# Patient Record
Sex: Male | Born: 1970 | Race: Black or African American | Hispanic: No | Marital: Married | Smoking: Current every day smoker
Health system: Southern US, Community
[De-identification: ages and names within clinical notes are randomized; demographics above are authoritative.]

## PROBLEM LIST (undated history)

## (undated) DIAGNOSIS — D649 Anemia, unspecified: Secondary | ICD-10-CM

## (undated) DIAGNOSIS — I10 Essential (primary) hypertension: Secondary | ICD-10-CM

## (undated) DIAGNOSIS — J45909 Unspecified asthma, uncomplicated: Secondary | ICD-10-CM

## (undated) HISTORY — PX: NO PAST SURGERIES: SHX2092

## (undated) HISTORY — DX: Anemia, unspecified: D64.9

## (undated) HISTORY — DX: Essential (primary) hypertension: I10

---

## 1998-09-28 ENCOUNTER — Emergency Department (HOSPITAL_COMMUNITY): Admission: EM | Admit: 1998-09-28 | Discharge: 1998-09-28 | Payer: Self-pay | Admitting: Emergency Medicine

## 2003-07-07 ENCOUNTER — Emergency Department (HOSPITAL_COMMUNITY): Admission: EM | Admit: 2003-07-07 | Discharge: 2003-07-07 | Payer: Self-pay | Admitting: Emergency Medicine

## 2010-10-13 ENCOUNTER — Emergency Department (HOSPITAL_COMMUNITY)
Admission: EM | Admit: 2010-10-13 | Discharge: 2010-10-13 | Discharge status: Against Medical Advice | Payer: Self-pay | Attending: Emergency Medicine | Admitting: Emergency Medicine

## 2010-10-13 DIAGNOSIS — R030 Elevated blood-pressure reading, without diagnosis of hypertension: Secondary | ICD-10-CM | POA: Insufficient documentation

## 2011-09-24 ENCOUNTER — Emergency Department (HOSPITAL_COMMUNITY)
Admission: EM | Admit: 2011-09-24 | Discharge: 2011-09-25 | Disposition: A | Discharge status: Home / Self Care | Payer: Self-pay | Attending: Emergency Medicine | Admitting: Emergency Medicine

## 2011-09-24 ENCOUNTER — Encounter (HOSPITAL_COMMUNITY): Payer: Self-pay | Admitting: Emergency Medicine

## 2011-09-24 DIAGNOSIS — R000 Tachycardia, unspecified: Secondary | ICD-10-CM | POA: Insufficient documentation

## 2011-09-24 DIAGNOSIS — I160 Hypertensive urgency: Secondary | ICD-10-CM

## 2011-09-24 DIAGNOSIS — R7309 Other abnormal glucose: Secondary | ICD-10-CM | POA: Insufficient documentation

## 2011-09-24 DIAGNOSIS — J45909 Unspecified asthma, uncomplicated: Secondary | ICD-10-CM | POA: Insufficient documentation

## 2011-09-24 DIAGNOSIS — R030 Elevated blood-pressure reading, without diagnosis of hypertension: Secondary | ICD-10-CM | POA: Insufficient documentation

## 2011-09-24 DIAGNOSIS — F17200 Nicotine dependence, unspecified, uncomplicated: Secondary | ICD-10-CM | POA: Insufficient documentation

## 2011-09-24 DIAGNOSIS — R739 Hyperglycemia, unspecified: Secondary | ICD-10-CM

## 2011-09-24 LAB — CBC
MCH: 29.2 pg (ref 26.0–34.0)
MCV: 82.8 fL (ref 78.0–100.0)
Platelets: 181 10*3/uL (ref 150–400)
RDW: 13.3 % (ref 11.5–15.5)
WBC: 7.6 10*3/uL (ref 4.0–10.5)

## 2011-09-24 LAB — DIFFERENTIAL
Basophils Absolute: 0.1 10*3/uL (ref 0.0–0.1)
Basophils Relative: 1 % (ref 0–1)
Eosinophils Absolute: 0.4 10*3/uL (ref 0.0–0.7)
Eosinophils Relative: 5 % (ref 0–5)
Lymphocytes Relative: 51 % — ABNORMAL HIGH (ref 12–46)

## 2011-09-24 LAB — BASIC METABOLIC PANEL
CO2: 24 mEq/L (ref 19–32)
Calcium: 8.9 mg/dL (ref 8.4–10.5)
Creatinine, Ser: 1.2 mg/dL (ref 0.50–1.35)

## 2011-09-24 NOTE — ED Notes (Addendum)
Blood pressure at home was 264/163 right hand, baseline BP 160/90. Not taking any blood pressure. frequent HA and vision problem to left eye

## 2011-09-24 NOTE — ED Notes (Signed)
PT. REPORTED ELEVATED BLOOD PRESSURE THIS EVENING AT HOME = 200/160 , STATES FEELS TIRED AND DIZZY.

## 2011-09-25 NOTE — ED Provider Notes (Signed)
History     CSN: [id number]  Arrival date & time 09/24/11  2257   First MD Initiated Contact with Patient 09/24/11 2332      Chief Complaint  Patient presents with  . Hypertension    (Consider location/radiation/quality/duration/timing/severity/associated sxs/prior treatment) HPI The patient presents with concerns of lightheadedness, hypertension, fatigue.  He notes that over the course the day he has felt fatigued, with mild intermittent lightheadedness.  As these symptoms persisted he checked his blood pressure, and found his readings were elevated.  He notes the result was 200/160.  He presents here for evaluation of these elevated results.  He notes that since the time of taking it reading until my evaluation his spontaneously become better.  He notes that he is without any complaints on my evaluation. History the past week his intermittent complaints have been occurring with seemingly spontaneous onset and offset.  He does note that he has been using his albuterol inhaler with greater frequency over this time period. The patient denies a history of hypertension, states that asthma is his only medical problem. History reviewed. No pertinent past medical history.  History reviewed. No pertinent past surgical history.  No family history on file.  History  Substance Use Topics  . Smoking status: Current Everyday Smoker  . Smokeless tobacco: Not on file  . Alcohol Use: Yes      Review of Systems  Constitutional:       Per HPI, otherwise negative  HENT:       Per HPI, otherwise negative  Eyes: Negative.   Respiratory:       Per HPI, otherwise negative  Cardiovascular:       Per HPI, otherwise negative  Gastrointestinal: Negative for vomiting.  Genitourinary: Negative.   Musculoskeletal:       Per HPI, otherwise negative  Skin: Negative.   Neurological: Negative for syncope.    Allergies  Review of patient's allergies indicates no known allergies.  Home  Medications   Current Outpatient Rx  Name Route Sig Dispense Refill  . ALBUTEROL SULFATE HFA 108 (90 BASE) MCG/ACT IN AERS Inhalation Inhale 2 puffs into the lungs every 6 (six) hours as needed. For shortness of breath/asthma symptoms    . DIPHENHYDRAMINE HCL 25 MG PO CAPS Oral Take 25 mg by mouth daily as needed. For allergies    . THERA M PLUS PO TABS Oral Take 1 tablet by mouth daily.      BP 144/95  Pulse 87  Temp 97.9 F (36.6 C) (Oral)  Resp 19  SpO2 95%  Physical Exam  Nursing note and vitals reviewed. Constitutional: He is oriented to person, place, and time. He appears well-developed. No distress.  HENT:  Head: Normocephalic and atraumatic.  Eyes: Conjunctivae and EOM are normal.  Cardiovascular: Normal rate and regular rhythm.   Pulmonary/Chest: Effort normal. No stridor. No respiratory distress.  Abdominal: He exhibits no distension.  Musculoskeletal: He exhibits no edema.  Neurological: He is alert and oriented to person, place, and time. He displays no atrophy and no tremor. No cranial nerve deficit or sensory deficit. He exhibits normal muscle tone. He displays no seizure activity. Coordination normal.  Skin: Skin is warm and dry.  Psychiatric: He has a normal mood and affect.    ED Course  Procedures (including critical care time)  Labs Reviewed  BASIC METABOLIC PANEL - Abnormal; Notable for the following:    Glucose, Bld 166 (*)     GFR calc non Af Amer 75 (*)  GFR calc Af Amer 87 (*)     All other components within normal limits  DIFFERENTIAL - Abnormal; Notable for the following:    Neutrophils Relative 37 (*)     Lymphocytes Relative 51 (*)     All other components within normal limits  CBC   No results found.   No diagnosis found.   Cardiac 80 sinus rhythm normal Pulse oximetry 96% room air normal    Date: 09/25/2011 #1  Rate: 75  Rhythm: normal sinus rhythm  QRS Axis: normal  Intervals: normal  ST/T Wave abnormalities: early  repolarization  Conduction Disutrbances:none  Narrative Interpretation:   Old EKG Reviewed: none available BORDERLINE (LVH, repol)   Date: 09/25/2011 #2 - Patient noted chest fluttering sensation  Rate: 66  Rhythm: normal sinus rhythm and sinus arrhythmia  QRS Axis: normal  Intervals: normal  ST/T Wave abnormalities: early repolarization  Conduction Disutrbances:none  Narrative Interpretation:   Old EKG Reviewed: changes noted Borderline ECG   MDM  This 41 year old male presents with concerns of hypertension, intermittent lightheadedness.  Notably, throughout the patient's ED stay his vital signs were essentially unremarkable with one mildly elevated blood pressure.  The patient was mildly tachycardic, and his evaluation demonstrated hyperglycemia, without evidence of endorgan effects of sustained hypertension.  The patient's recollection of innumerable episodes of chest fluttering reoccurred while he was in the emergency department, and a repeat ECG didn't demonstrated sinus arrhythmia.  The remainder of the patient's evaluation was not remarkable for suggestion of ongoing cardiac ischemia, as he denied any significant chest pain, stated that his fluttering sensation was similar to that he is felt on in numerable prior episodes.  I discussed the need for ongoing primary care management of the patient's blood pressure, and glucose, at length with the patient and his wife.  He was discharged in stable condition.       Gerhard Munch, MD 09/25/11 [phone]

## 2011-09-25 NOTE — ED Notes (Signed)
Lengthening of t-p wave increased.  Pt states heart feels "different", but denies pain.  EKG shot to give to Dr Jeraldine Loots.

## 2011-09-25 NOTE — ED Notes (Signed)
Pt sats went down to 77. Went into the room and told pt to take some deep breaths and sats went up to 95. When asked if he had anxiety he said "the beeping on the machine was making him nervous"

## 2011-09-25 NOTE — Discharge Instructions (Signed)
It is very important that you follow up with your physician, be telephoned to arrange the next available appointment to continue evaluation of your blood pressure and your chest fluttering sensation.  Notably, tight evaluation also demonstrated that your sugar was elevated, and this should be discussed as well.  If you develop any new, or concerning changes in the interim a, please make sure to return to emergency department immediately for additional evaluation.

## 2012-09-02 ENCOUNTER — Emergency Department (HOSPITAL_COMMUNITY)
Admission: EM | Admit: 2012-09-02 | Discharge: 2012-09-02 | Disposition: A | Discharge status: Home / Self Care | Payer: BC Managed Care – PPO | Attending: Emergency Medicine | Admitting: Emergency Medicine

## 2012-09-02 ENCOUNTER — Encounter (HOSPITAL_COMMUNITY): Payer: Self-pay | Admitting: Emergency Medicine

## 2012-09-02 DIAGNOSIS — K089 Disorder of teeth and supporting structures, unspecified: Secondary | ICD-10-CM | POA: Insufficient documentation

## 2012-09-02 DIAGNOSIS — K0889 Other specified disorders of teeth and supporting structures: Secondary | ICD-10-CM

## 2012-09-02 DIAGNOSIS — J45901 Unspecified asthma with (acute) exacerbation: Secondary | ICD-10-CM | POA: Insufficient documentation

## 2012-09-02 DIAGNOSIS — Z87891 Personal history of nicotine dependence: Secondary | ICD-10-CM | POA: Insufficient documentation

## 2012-09-02 DIAGNOSIS — Z79899 Other long term (current) drug therapy: Secondary | ICD-10-CM | POA: Insufficient documentation

## 2012-09-02 HISTORY — DX: Unspecified asthma, uncomplicated: J45.909

## 2012-09-02 MED ORDER — NAPROXEN 500 MG PO TABS: 500.0000 mg | ORAL_TABLET | Freq: Two times a day (BID) | ORAL | Status: DC

## 2012-09-02 MED ORDER — AMOXICILLIN 500 MG PO CAPS: 500.0000 mg | ORAL_CAPSULE | Freq: Three times a day (TID) | ORAL | Status: DC

## 2012-09-02 MED ORDER — HYDROCODONE-ACETAMINOPHEN 5-325 MG PO TABS
1.0000 | ORAL_TABLET | Freq: Once | ORAL | Status: AC
  Administered 2012-09-02: 1 via ORAL
  Filled 2012-09-02: qty 1

## 2012-09-02 MED ORDER — HYDROCODONE-ACETAMINOPHEN 5-325 MG PO TABS: 1.0000 | ORAL_TABLET | Freq: Four times a day (QID) | ORAL | Status: DC | PRN

## 2012-09-02 NOTE — ED Notes (Signed)
Pt c/o R lower wisdom tooth pain x 2 days.

## 2012-09-02 NOTE — ED Provider Notes (Signed)
History    This chart was scribed for non-physician practitioner, Lottie Mussel, PA-C, working with Geoffery Lyons, MD by Melba Coon, ED Scribe. This patient was seen in room TR06C/TR06C and the patient's care was started at 6:41PM.   CSN: [id number]  Arrival date & time 09/02/12  1714   First MD Initiated Contact with Patient 09/02/12 1804      Chief Complaint  Patient presents with  . Dental Pain    (Consider location/radiation/quality/duration/timing/severity/associated sxs/prior treatment) The history is provided by the patient. No language interpreter was used.   HPI Comments: Casey [name] is a 42 y.o. male who presents to the Emergency Department complaining of constant, moderate to severe right lower wisdom tooth pain with an onset 2 days ago that has been getting progressively worse. The pain has been severe enough to awake him from sleep. He reports chills at home and here at the ED but temp here at the ED was 98.4. He reports that moving his jaw aggravates the pain.  He reports that he has been recommended for wisdom tooth removal in the past about a year ago. No known allergies. No other pertinent medical symptoms. No facial swelling, no swelling under the tongue.   Past Medical History  Diagnosis Date  . Asthma     History reviewed. No pertinent past surgical history.  No family history on file.  History  Substance Use Topics  . Smoking status: Former Smoker    Quit date: 05/05/2012  . Smokeless tobacco: Not on file  . Alcohol Use: Yes      Review of Systems  Constitutional: Negative for fever.  HENT: Positive for dental problem. Negative for sore throat, facial swelling, drooling, trouble swallowing, neck pain, neck stiffness and voice change.   Eyes: Negative for photophobia and visual disturbance.  Respiratory: Negative for shortness of breath.   Cardiovascular: Negative for chest pain.  Gastrointestinal: Negative for nausea and vomiting.   Musculoskeletal: Negative for back pain.  Skin: Negative for rash.  Neurological: Negative for weakness, light-headedness, numbness and headaches.  Psychiatric/Behavioral: The patient is not nervous/anxious.   All other systems reviewed and are negative.    Allergies  Review of patient's allergies indicates no known allergies.  Home Medications   Current Outpatient Rx  Name  Route  Sig  Dispense  Refill  . albuterol (PROVENTIL HFA;VENTOLIN HFA) 108 (90 BASE) MCG/ACT inhaler   Inhalation   Inhale 2 puffs into the lungs every 6 (six) hours as needed. For shortness of breath/asthma symptoms         . Aspirin-Salicylamide-Caffeine (BC HEADACHE POWDER PO)   Oral   Take 1 packet by mouth daily as needed (for pain).         . benzocaine (ORAJEL) 10 % mucosal gel   Mouth/Throat   Use as directed 1 application in the mouth or throat as needed for pain.         Marland Kitchen loratadine (CLARITIN) 10 MG tablet   Oral   Take 10 mg by mouth daily as needed for allergies.         . Multiple Vitamins-Minerals (MULTIVITAMINS THER. W/MINERALS) TABS   Oral   Take 1 tablet by mouth daily.           BP 177/101  Pulse 71  Temp(Src) 98.4 F (36.9 C) (Oral)  Resp 18  SpO2 98%  Physical Exam  Nursing note and vitals reviewed. Constitutional: He is oriented to person, place, and time. He appears  well-developed and well-nourished. No distress.  HENT:  Head: Normocephalic and atraumatic. No trismus in the jaw.  Mouth/Throat: No dental abscesses. No tonsillar abscesses.  Oropharynx is clear. Teeth appear carious. Very tender right lower molar tooth that is partially erupted with erythema and edema of the underlying gingiva. No trismus  Neck: Normal range of motion. Neck supple.  Neck is nontender and supple without adenopathy or JVD.  Cardiovascular: Normal rate.   Pulmonary/Chest: Effort normal. No respiratory distress. He has no wheezes.  Abdominal: Soft. There is no tenderness.   Musculoskeletal: Normal range of motion.  Neurological: He is alert and oriented to person, place, and time. No cranial nerve deficit.  Skin: Skin is warm and dry. He is not diaphoretic.  Psychiatric: He has a normal mood and affect.    ED Course  Procedures (including critical care time)  COORDINATION OF CARE:  6:45PM - norco, naproxen, and amoxicillin will be prescribed for Casey [name]. Referral given to on call dentist. Advised to f/u with dentist if symptoms worsen. He is ready for d/c.   Labs Reviewed - No data to display No results found.   1. Pain, dental       MDM  Pt with right lower molar tooth that is partially erupted with surrounding swelling, and erythema. No facial swelling or swelling under the tongue. No trismus. Suspect early infection. Will start on amoxil, norco and naprosyn for pain. Follow up with a dentist.   Filed Vitals:   09/02/12 1719 09/02/12 1900  BP: 177/101 159/100  Pulse: 71 66  Temp: 98.4 F (36.9 C)   TempSrc: Oral   Resp: 18 16  SpO2: 98% 97%    I personally performed the services described in this documentation, which was scribed in my presence. The recorded information has been reviewed and is accurate.      Lottie Mussel, PA-C 09/02/12 2002

## 2012-09-03 NOTE — ED Provider Notes (Signed)
Medical screening examination/treatment/procedure(s) were performed by non-physician practitioner and as supervising physician I was immediately available for consultation/collaboration.  Geoffery Lyons, MD 09/03/12 [phone]

## 2012-10-24 ENCOUNTER — Ambulatory Visit (INDEPENDENT_AMBULATORY_CARE_PROVIDER_SITE_OTHER): Payer: BC Managed Care – PPO | Admitting: Physician Assistant

## 2012-10-24 VITALS — BP 140/84 | HR 71 | Temp 98.0°F | Resp 16 | Ht 68.5 in | Wt 178.0 lb

## 2012-10-24 DIAGNOSIS — J45909 Unspecified asthma, uncomplicated: Secondary | ICD-10-CM

## 2012-10-24 DIAGNOSIS — J4520 Mild intermittent asthma, uncomplicated: Secondary | ICD-10-CM

## 2012-10-24 DIAGNOSIS — Z9109 Other allergy status, other than to drugs and biological substances: Secondary | ICD-10-CM

## 2012-10-24 MED ORDER — MONTELUKAST SODIUM 10 MG PO TABS: 10.0000 mg | ORAL_TABLET | Freq: Every day | ORAL | Status: DC

## 2012-10-24 MED ORDER — TRIAMCINOLONE ACETONIDE(NASAL) 55 MCG/ACT NA INHA: 2.0000 | Freq: Every day | NASAL | Status: DC

## 2012-10-24 MED ORDER — ALBUTEROL SULFATE HFA 108 (90 BASE) MCG/ACT IN AERS: 2.0000 | INHALATION_SPRAY | Freq: Four times a day (QID) | RESPIRATORY_TRACT | Status: DC | PRN

## 2012-10-24 NOTE — Patient Instructions (Addendum)
Recheck in 3 months -  Goal is albuterol use <2x/week during the day and less than 2x/month at night

## 2012-10-24 NOTE — Progress Notes (Signed)
   [address], [place] [address]   Phone [phone]   Subjective:    Patient ID: Casey [name], male    DOB: July 06, 1970, 42 y.o.   MRN: [id number]  HPI  Pt presents to clinic for asthma meds and eval.  Pt has asthma and he has been out of his meds for several months.  He typically uses about 4 albuterol inhalers over a year and feels like he uses less when he takes daily Singulair.  They tried him on Pulmicort but is unsure if it really helped. He typically gets asthma problems when the seasons change and when he gets sick.  He has stopped smoking and now works in a controlled environment which he thinks has helped with his SOB.  Review of Systems  Respiratory: Positive for cough, shortness of breath and wheezing.        None of these are current.       Objective:   Physical Exam  Vitals reviewed. Constitutional: He is oriented to person, place, and time. He appears well-developed and well-nourished.  HENT:  Head: Normocephalic and atraumatic.  Right Ear: External ear normal.  Left Ear: External ear normal.  Eyes: Conjunctivae are normal.  Cardiovascular: Normal rate, regular rhythm and normal heart sounds.   No murmur heard. Pulmonary/Chest: Effort normal and breath sounds normal.  Neurological: He is alert and oriented to person, place, and time.  Skin: Skin is warm and dry.  Psychiatric: He has a normal mood and affect. His behavior is normal. Judgment and thought content normal.        Assessment & Plan:  Asthma, allergic, mild intermittent, uncomplicated - Plan: albuterol (PROVENTIL HFA;VENTOLIN HFA) 108 (90 BASE) MCG/ACT inhaler, montelukast (SINGULAIR) 10 MG tablet, triamcinolone (NASACORT) 55 MCG/ACT nasal inhaler  Environmental allergies  I d/w pt the control that we are looking at with his asthma - we will start with allergy treatment and then he will recheck and if he is better - great and if his use of albuterol is still high we will add an inhaled steroid  for asthma treatment.  Pt understands and agrees.  Benny Lennert PA-C 10/24/2012 11:16 AM

## 2012-10-24 NOTE — Progress Notes (Signed)
  Subjective:    Patient ID: Casey [name], male    DOB: September 03, 1970, 42 y.o.   MRN: [id number]  HPI    Review of Systems     Objective:   Physical Exam        Assessment & Plan:

## 2012-10-25 NOTE — Progress Notes (Signed)
Left msg for pt to schedule 3 month f-up with Maralyn Sago.

## 2012-11-01 NOTE — Progress Notes (Signed)
Sent pt reminder letter to schedule 3 month f/u appt with Maralyn Sago.

## 2013-03-11 ENCOUNTER — Other Ambulatory Visit: Payer: Self-pay | Admitting: Physician Assistant

## 2013-03-11 NOTE — Telephone Encounter (Signed)
Needs ov, was due Oct 2014

## 2013-03-13 ENCOUNTER — Other Ambulatory Visit: Payer: Self-pay | Admitting: Physician Assistant

## 2013-04-15 ENCOUNTER — Other Ambulatory Visit: Payer: Self-pay | Admitting: Physician Assistant

## 2013-07-03 ENCOUNTER — Other Ambulatory Visit: Payer: Self-pay | Admitting: Physician Assistant

## 2013-08-26 ENCOUNTER — Ambulatory Visit (INDEPENDENT_AMBULATORY_CARE_PROVIDER_SITE_OTHER): Payer: BC Managed Care – PPO | Admitting: Physician Assistant

## 2013-08-26 VITALS — BP 120/74 | HR 73 | Temp 98.3°F | Resp 18 | Ht 71.0 in | Wt 187.0 lb

## 2013-08-26 DIAGNOSIS — J302 Other seasonal allergic rhinitis: Secondary | ICD-10-CM

## 2013-08-26 DIAGNOSIS — J309 Allergic rhinitis, unspecified: Secondary | ICD-10-CM

## 2013-08-26 DIAGNOSIS — J45909 Unspecified asthma, uncomplicated: Secondary | ICD-10-CM

## 2013-08-26 DIAGNOSIS — R062 Wheezing: Secondary | ICD-10-CM

## 2013-08-26 MED ORDER — ALBUTEROL SULFATE (2.5 MG/3ML) 0.083% IN NEBU
2.5000 mg | INHALATION_SOLUTION | Freq: Once | RESPIRATORY_TRACT | Status: AC
  Administered 2013-08-26: 2.5 mg via RESPIRATORY_TRACT

## 2013-08-26 MED ORDER — MOMETASONE FUROATE 50 MCG/ACT NA SUSP: 2.0000 | Freq: Every day | NASAL | Status: DC

## 2013-08-26 MED ORDER — ALBUTEROL SULFATE (2.5 MG/3ML) 0.083% IN NEBU: 2.5000 mg | INHALATION_SOLUTION | Freq: Four times a day (QID) | RESPIRATORY_TRACT | Status: DC | PRN

## 2013-08-26 MED ORDER — PROAIR HFA 108 (90 BASE) MCG/ACT IN AERS: 2.0000 | INHALATION_SPRAY | Freq: Four times a day (QID) | RESPIRATORY_TRACT | Status: DC | PRN

## 2013-08-26 MED ORDER — BECLOMETHASONE DIPROPIONATE 80 MCG/ACT IN AERS: 1.0000 | INHALATION_SPRAY | Freq: Two times a day (BID) | RESPIRATORY_TRACT | Status: DC

## 2013-08-26 MED ORDER — MONTELUKAST SODIUM 10 MG PO TABS: 10.0000 mg | ORAL_TABLET | Freq: Every day | ORAL | Status: DC

## 2013-08-26 NOTE — Progress Notes (Signed)
   Subjective:    Patient ID: Casey [name], male    DOB: 1970/11/11, 43 y.o.   MRN: [id number]  HPI Pt presents for a recheck of his asthma.  He has been out of his albuterol inhaler for months and has needed it but has not had a chance to come back for a recheck.  He used the nasal steroid for his allergies and his congestion was improved but he did not notice that his use of the albuterol inhaler decreased.  His albuterol use is about 2x/weekly but recently he has felt that he would use it bid if he had it.  He has been on his Claritin and his Singulair since his last visit.  Review of Systems  Constitutional: Negative for fever and chills.  Respiratory: Positive for shortness of breath and wheezing. Negative for cough.   Allergic/Immunologic: Positive for environmental allergies.       Objective:   Physical Exam  Vitals reviewed. Constitutional: He is oriented to person, place, and time. He appears well-developed and well-nourished.  HENT:  Head: Normocephalic and atraumatic.  Right Ear: External ear normal.  Left Ear: External ear normal.  Neck: Normal range of motion.  Cardiovascular: Normal rate, regular rhythm and normal heart sounds.   No murmur heard. Pulmonary/Chest: Effort normal. He has wheezes (inspiratory < expiratory wheezing - pt unable to speak in long sentences - given neb - after neb patient  reports improved breathing and lung feilds are clear without any wheezing).  Lymphadenopathy:    He has no cervical adenopathy.  Neurological: He is alert and oriented to person, place, and time.  Skin: Skin is warm and dry.  Psychiatric: He has a normal mood and affect. His behavior is normal. Judgment and thought content normal.   Pt given albuterol neb - Peak flow prior is 200 and after neb treatment peak flow increased to 350 - pt is then able to speak in full sentences    Assessment & Plan:  Wheezing - Plan: albuterol (PROVENTIL) (2.5 MG/3ML) 0.083% nebulizer solution  2.5 mg  Intrinsic asthma - Plan: PROAIR HFA 108 (90 BASE) MCG/ACT inhaler, montelukast (SINGULAIR) 10 MG tablet, albuterol (PROVENTIL) (2.5 MG/3ML) 0.083% nebulizer solution, beclomethasone (QVAR) 80 MCG/ACT inhaler  Seasonal allergies - Plan: mometasone (NASONEX) 50 MCG/ACT nasal spray  We will continue seasonal allergy treatment but it seems that his asthma did not improve with allergy treatment so we will start an inhaled steroid at today's visit.  He will recheck with me in 1 month and we will determine if further asthma treatment is indicated at that time.  Benny Lennert PA-C  [facility] 08/26/2013 11:34 AM

## 2013-08-26 NOTE — Patient Instructions (Signed)
See me in 4-5 weeks - keep taking your Claritin

## 2013-09-05 NOTE — Progress Notes (Signed)
LMVM to CB to schedule appt with Benny Lennert.

## 2013-09-24 ENCOUNTER — Other Ambulatory Visit: Payer: Self-pay | Admitting: Physician Assistant

## 2013-10-15 ENCOUNTER — Other Ambulatory Visit: Payer: Self-pay | Admitting: Physician Assistant

## 2013-11-14 ENCOUNTER — Ambulatory Visit (INDEPENDENT_AMBULATORY_CARE_PROVIDER_SITE_OTHER): Payer: BC Managed Care – PPO | Admitting: Family Medicine

## 2013-11-14 VITALS — BP 152/90 | HR 98 | Temp 98.1°F | Resp 16 | Ht 68.0 in | Wt 182.0 lb

## 2013-11-14 DIAGNOSIS — I10 Essential (primary) hypertension: Secondary | ICD-10-CM | POA: Insufficient documentation

## 2013-11-14 DIAGNOSIS — F411 Generalized anxiety disorder: Secondary | ICD-10-CM | POA: Insufficient documentation

## 2013-11-14 DIAGNOSIS — J45909 Unspecified asthma, uncomplicated: Secondary | ICD-10-CM

## 2013-11-14 DIAGNOSIS — R002 Palpitations: Secondary | ICD-10-CM

## 2013-11-14 DIAGNOSIS — J4540 Moderate persistent asthma, uncomplicated: Secondary | ICD-10-CM

## 2013-11-14 MED ORDER — AEROCHAMBER PLUS MISC: Status: DC

## 2013-11-14 MED ORDER — PROAIR HFA 108 (90 BASE) MCG/ACT IN AERS: 1.0000 | INHALATION_SPRAY | Freq: Four times a day (QID) | RESPIRATORY_TRACT | Status: DC | PRN

## 2013-11-14 MED ORDER — ATENOLOL 50 MG PO TABS: 50.0000 mg | ORAL_TABLET | Freq: Every day | ORAL | Status: DC

## 2013-11-14 MED ORDER — BECLOMETHASONE DIPROPIONATE 80 MCG/ACT IN AERS: 1.0000 | INHALATION_SPRAY | Freq: Two times a day (BID) | RESPIRATORY_TRACT | Status: DC

## 2013-11-14 NOTE — Assessment & Plan Note (Addendum)
Regular PACs on exam.    No prior family hx of early cardiac issues.  Pt is occasional smoker and no issues with functional status, pt actually reports improvement in symptoms with activity.  Rx Atenolol for cardioselective properties given hx of Asthma. Check TSH and basic labs for risk stratification and to ensure no thyroid contribution to anxiety and cardiac effects. If worsening or no better consider referral to cardiology for further evaluation

## 2013-11-14 NOTE — Assessment & Plan Note (Signed)
Pt reports multiple stressors and markedly elevated ZUNG score. Discussed options for treatment and pt to follow up for CPE in 1 month and consideration of additional anxiety treatment if not improved with improved control of PACs.   > consider SSRI but pt resistant today; consider Mood Disorder Questionaire as well to screen for BiPolar disorder

## 2013-11-14 NOTE — Patient Instructions (Signed)
Please schedule a follow up appointment in 1 month in the appointment center for a complete physical and recheck of your BP and asthma.    Premature Beats A premature beat is an extra heartbeat that happens earlier than normal. Premature beats are called premature atrial contractions (PACs) or premature ventricular contractions (PVCs) depending on the area of the heart where they start. CAUSES  Premature beats may be brought on by a variety of factors including:  Emotional stress.  Lack of sleep.  Caffeine.  Asthma medicines.  Stimulants.  Herbal teas.  Dietary supplements.  Alcohol. In most cases, premature beats are not dangerous and are not a sign of serious heart disease. Most patients evaluated for premature beats have completely normal heart function. Rarely, premature beats may be a sign of more significant heart problems or medical illness. SYMPTOMS  Premature beats may cause palpitations. This means you feel like your heart is skipping a beat or beating harder than usual. Sometimes, slight chest pain occurs with premature beats, lasting only a few seconds. This pain has been described as a "flopping" feeling inside the chest. In many cases, premature beats do not cause any symptoms and they are only detected when an electrocardiography test (EKG) or heart monitoring is performed. DIAGNOSIS  Your caregiver may run some tests to evaluate your heart such as an EKG or echocardiography. You may need to wear a portable heart monitor for several days to record the electrical activity of your heart. Blood testing may also be performed to check your electrolytes and thyroid function. TREATMENT  Premature beats usually go away with rest. If the problem continues, your caregiver will determine a treatment plan for you.  HOME CARE INSTRUCTIONS  Get plenty of rest over the next few days until your symptoms improve.  Avoid coffee, tea, alcohol, and soda (pop, cola).  Do not  smoke. SEEK MEDICAL CARE IF:  Your symptoms continue after 1 to 2 days of rest.  You have new symptoms, such as chest pain or trouble breathing. SEEK IMMEDIATE MEDICAL CARE IF:  You have severe chest pain or abdominal pain.  You have pain that radiates into the neck, arm, or jaw.  You faint or have extreme weakness.  You have shortness of breath.  Your heartbeat races for more than 5 seconds. MAKE SURE YOU:  Understand these instructions.  Will watch your condition.  Will get help right away if you are not doing well or get worse. Document Released: 04/28/2004 Document Revised: 06/13/2011 Document Reviewed: 11/22/2010 [facility] Patient Information 2015 [employer]. This information is not intended to replace advice given to you by your health care provider. Make sure you discuss any questions you have with your health care provider.

## 2013-11-14 NOTE — Assessment & Plan Note (Signed)
Long standing. Occasional smoker - encouraged to quit. Discussed importance of controller medication given heavy reliance on Albuterol only. New Rx for QVAR + spacer and albuterol provided today.

## 2013-11-14 NOTE — Progress Notes (Signed)
  Casey [name] - 43 y.o. male MRN [id number]  Date of birth: 04-Apr-1971  SUBJECTIVE:  Including CC & ROS.  The patient presents for evaluation of: Palpitations & Chest pain: 2 months of worsening symptoms. Occur only at rest.  Symptoms improve with activity/exertion.  No dyspnea on exertion.  No pain or chest tightening with activity but will have intermittent episodes of sharp shooting left sided chest pain while at rest with some radiation to left axilla/arm that lasts for 10 minutes. Spontaneously resolve. Denies LE edema, presyncope, orthopnea/PND.  Prior  Anxiety/Stress: Reports has felt worse in the past 2 months. Disordered sleep. Only 1-2 hours at night. + racing thoughts.  HTN: Reports long standing hx with prior treatment with metoprolol but has not been on anything recently due to intolerances.  No prior MI/CVA or family hx of early CVD. Dose get occasional headaches with ?blurring of vision; no amorosis fugax. Pt denies facial asymmetry, unilateral weakness or dysarthria.  Asthma: Long standing.  Not on any controllers. Currently out of meds.  Not previously on controller med.  Prior BB use for BP worsened breathing.   HISTORY: Past Medical, Surgical, Social, and Family History Reviewed & Updated per EMR. Pertinent Historical Findings include: Hx of Asthma No prior HTN meds but 1 episode of HTN urgency in 2013 but no malignant range BPs Current occasional smoker. Not daily use.  DATA REVIEWED: EKG: 11/14/13 Rate & Rhythm: 65 bpm Axis:   leftward Intervals:  normal Other:   Frequent PACs, no ischemia, no ST changes  PHYSICAL EXAM:  VS: BP:152/90 mmHg  HR:98bpm  TEMP:98.1 F (36.7 C)(Oral)  RESP:98 %  HT:5\' 8"  (172.7 cm)   WT:182 lb (82.555 kg)  BMI:27.7 PHYSICAL EXAM: GENERAL:  Adult African American  male. In no discomfort; no respiratory distress  PSYCH:  alert and appropriate, good insight.  Moderately anxious. HNEENT:  mmm, no JVD CARDIAC:  Regularly irregular rhythm  with regular rate.  No murmur.  LUNGS:  Mild wheezing; good air movement. EXTREM:  Warm, well perfused.  Moves all 4 extremities spontaneously; no lateralization.  No pretibial edema.  ZUNG Anxiety Self-Assessment Scale: total score = 55 - scanned into chart   ASSESSMENT & PLAN: See problem based charting & AVS for pt instructions.

## 2013-11-14 NOTE — Assessment & Plan Note (Signed)
Previously dx. Starting BB for cardiac effects as well. > F/u in 1 month for blood pressure recheck

## 2013-11-15 LAB — CBC
HEMATOCRIT: 48.8 % (ref 39.0–52.0)
HEMOGLOBIN: 16.7 g/dL (ref 13.0–17.0)
MCH: 28.4 pg (ref 26.0–34.0)
MCHC: 34.2 g/dL (ref 30.0–36.0)
MCV: 83 fL (ref 78.0–100.0)
Platelets: 190 10*3/uL (ref 150–400)
RBC: 5.88 MIL/uL — AB (ref 4.22–5.81)
RDW: 15 % (ref 11.5–15.5)
WBC: 6.7 10*3/uL (ref 4.0–10.5)

## 2013-11-15 LAB — LIPID PANEL
CHOL/HDL RATIO: 6.8 ratio
CHOLESTEROL: 260 mg/dL — AB (ref 0–200)
HDL: 38 mg/dL — AB (ref >39)
LDL Cholesterol: 168 mg/dL — ABNORMAL HIGH (ref 0–99)
TRIGLYCERIDES: 270 mg/dL — AB (ref <150)
VLDL: 54 mg/dL — AB (ref 0–40)

## 2013-11-15 LAB — COMPREHENSIVE METABOLIC PANEL
ALK PHOS: 61 U/L (ref 39–117)
ALT: 40 U/L (ref 0–53)
AST: 40 U/L — ABNORMAL HIGH (ref 0–37)
Albumin: 3.8 g/dL (ref 3.5–5.2)
BILIRUBIN TOTAL: 1 mg/dL (ref 0.2–1.2)
BUN: 13 mg/dL (ref 6–23)
CO2: 27 meq/L (ref 19–32)
CREATININE: 1.17 mg/dL (ref 0.50–1.35)
Calcium: 9.5 mg/dL (ref 8.4–10.5)
Chloride: 105 mEq/L (ref 96–112)
GLUCOSE: 76 mg/dL (ref 70–99)
Potassium: 3.9 mEq/L (ref 3.5–5.3)
Sodium: 139 mEq/L (ref 135–145)
TOTAL PROTEIN: 7.2 g/dL (ref 6.0–8.3)

## 2013-11-15 LAB — TSH: TSH: 1.316 u[IU]/mL (ref 0.350–4.500)

## 2013-11-22 ENCOUNTER — Encounter: Payer: Self-pay | Admitting: *Deleted

## 2013-11-27 ENCOUNTER — Telehealth: Payer: Self-pay | Admitting: *Deleted

## 2013-11-27 NOTE — Telephone Encounter (Signed)
lmom to cb about labwork  See labs.

## 2013-11-27 NOTE — Telephone Encounter (Signed)
Pt notified of results

## 2013-11-30 NOTE — Progress Notes (Signed)
History and physical examinations reviewed in detail.  Agree with assessment and plan. 

## 2013-12-05 ENCOUNTER — Telehealth: Payer: Self-pay

## 2013-12-05 ENCOUNTER — Telehealth: Payer: Self-pay | Admitting: Physician Assistant

## 2013-12-05 NOTE — Telephone Encounter (Signed)
Pt advised to come into the office. He is having trouble with erections and states his BP is still elevated. One BP he took was 180/110. Advised pt to RTC as soon as possible.

## 2013-12-05 NOTE — Telephone Encounter (Signed)
Pt of [name] is have complications with the atenolol (TENORMIN) 50 MG tablet he was prescribed. I asked if it was not working for him or if he was having some type of reaction, he said both, but did not want to go into details. Would like to speak with [name] or her nurse.

## 2013-12-05 NOTE — Telephone Encounter (Signed)
[name], LPN at 04/09/1094 2:45 PM    Status: Signed       Pt advised to come into the office. He is having trouble with erections and states his BP is still elevated. One BP he took was 180/110. Advised pt to RTC as soon as possible.       [name], LPN at 0/07/5407 2:37 PM     Status: Signed        LM for pt to RTC or rtn call.         [name] at 12/05/2013 9:05 AM     Status: Signed        Pt of Casey [name] is have complications with the atenolol (TENORMIN) 50 MG tablet he was prescribed. I asked if it was not working for him or if he was having some type of reaction, he said both, but did not want to go into details. Would like to speak with Casey [name] or her nurse.

## 2013-12-05 NOTE — Telephone Encounter (Signed)
LM for pt to RTC or rtn call.

## 2013-12-05 NOTE — Telephone Encounter (Signed)
Patient states he does not have the copay to RTC for an OV. Wants to know if we could send in a different script for him and he will try that for now instead of RTC. [facility].   [phone]

## 2013-12-06 MED ORDER — NEBIVOLOL HCL 5 MG PO TABS: 5.0000 mg | ORAL_TABLET | Freq: Every day | ORAL | Status: DC

## 2013-12-06 NOTE — Telephone Encounter (Signed)
Call --- recommend stopping Atenolol due to side effects and starting Bystolic  daily.  I have sent rx to pharmacy for Bystolic.

## 2013-12-06 NOTE — Telephone Encounter (Signed)
Lm to advise pt new script sent to the pharmacy. Pt has discontinued Atenolol per conversation 9/3

## 2013-12-06 NOTE — Telephone Encounter (Signed)
Pt is wondering if his bp medicine has been called in?

## 2013-12-10 ENCOUNTER — Telehealth: Payer: Self-pay

## 2013-12-10 NOTE — Telephone Encounter (Signed)
Medication was over 80 dollars. Pt was unable to pay for it. Directed pt to [employer] website for their pt assistance program. Registering on this website will provide pt with up to $50 voucher to get rx. Pt will contact us if he needs anything further.

## 2013-12-10 NOTE — Telephone Encounter (Signed)
Pt would like to speak with Maralyn Sago regarding some blood pressure medicine she put him on. Please call [phone]

## 2013-12-15 ENCOUNTER — Telehealth: Payer: Self-pay

## 2013-12-15 NOTE — Telephone Encounter (Signed)
Patient would like for Casey [name] to give him a call about his blood pressure medication. [phone]

## 2013-12-17 NOTE — Telephone Encounter (Signed)
LM for pt to rtn call. 

## 2013-12-22 NOTE — Telephone Encounter (Signed)
LM for pt to rtn call. 

## 2013-12-31 NOTE — Telephone Encounter (Signed)
Pt was able to get his medication questions answered. He is not taking the new BP medication. He is taking his old blood pressure medication- does not recall name. He states he has an appt tomorrow.

## 2014-01-01 ENCOUNTER — Ambulatory Visit (INDEPENDENT_AMBULATORY_CARE_PROVIDER_SITE_OTHER): Payer: BC Managed Care – PPO | Admitting: Family Medicine

## 2014-01-01 ENCOUNTER — Encounter: Payer: Self-pay | Admitting: Family Medicine

## 2014-01-01 VITALS — BP 158/98 | HR 62 | Temp 98.7°F | Resp 16 | Ht 68.75 in | Wt 182.8 lb

## 2014-01-01 DIAGNOSIS — K644 Residual hemorrhoidal skin tags: Secondary | ICD-10-CM

## 2014-01-01 DIAGNOSIS — J302 Other seasonal allergic rhinitis: Secondary | ICD-10-CM

## 2014-01-01 DIAGNOSIS — Z125 Encounter for screening for malignant neoplasm of prostate: Secondary | ICD-10-CM

## 2014-01-01 DIAGNOSIS — J45909 Unspecified asthma, uncomplicated: Secondary | ICD-10-CM

## 2014-01-01 DIAGNOSIS — K921 Melena: Secondary | ICD-10-CM

## 2014-01-01 DIAGNOSIS — Z0000 Encounter for general adult medical examination without abnormal findings: Secondary | ICD-10-CM

## 2014-01-01 DIAGNOSIS — Z131 Encounter for screening for diabetes mellitus: Secondary | ICD-10-CM

## 2014-01-01 DIAGNOSIS — R002 Palpitations: Secondary | ICD-10-CM

## 2014-01-01 DIAGNOSIS — G4700 Insomnia, unspecified: Secondary | ICD-10-CM

## 2014-01-01 DIAGNOSIS — J4540 Moderate persistent asthma, uncomplicated: Secondary | ICD-10-CM

## 2014-01-01 DIAGNOSIS — I10 Essential (primary) hypertension: Secondary | ICD-10-CM

## 2014-01-01 DIAGNOSIS — F411 Generalized anxiety disorder: Secondary | ICD-10-CM

## 2014-01-01 LAB — POCT URINALYSIS DIPSTICK
Bilirubin, UA: NEGATIVE
Glucose, UA: NEGATIVE
Nitrite, UA: NEGATIVE
RBC UA: NEGATIVE
SPEC GRAV UA: 1.015
Urobilinogen, UA: 0.2
pH, UA: 7

## 2014-01-01 LAB — IFOBT (OCCULT BLOOD): IFOBT: POSITIVE

## 2014-01-01 MED ORDER — HYDROCORTISONE ACETATE 25 MG RE SUPP: 25.0000 mg | Freq: Two times a day (BID) | RECTAL | Status: DC

## 2014-01-01 MED ORDER — AMLODIPINE BESYLATE 5 MG PO TABS: 5.0000 mg | ORAL_TABLET | Freq: Every day | ORAL | Status: DC

## 2014-01-01 NOTE — Patient Instructions (Addendum)
1.  START CLARITIN 10MG  ONE TABLET DAILY FOR ALLERGIES. 2.  START QVAR INHALER DAILY FOR ASTHMA. 3.  DECREASE ATENOLOL 50MG  TO 1/2 TABLET DAILY FOR ONE WEEK AND THEN STOP. 4. START AMLODIPINE 5MG  ONE TABLET DAILY FOR BLOOD PRESSURE. 5.  RETURN IN SIX WEEKS FOR BLOOD PRESSURE RECHECK. 6. WE WILL CONTACT YOU IN UPCOMING TWO WEEKS WITH APPOINTMENT WITH GASTROENTEROLOGIST FOR BLOODY STOOLS.  Keeping you healthy  Get these tests  Blood pressure- Have your blood pressure checked once a year by your healthcare provider.  Normal blood pressure is 120/80.  Weight- Have your body mass index (BMI) calculated to screen for obesity.  BMI is a measure of body fat based on height and weight. You can also calculate your own BMI at https://www.west-esparza.com/www.nhlbisupport.com/bmi/.  Cholesterol- Have your cholesterol checked regularly starting at age 41, sooner may be necessary if you have diabetes, high blood pressure, if a family member developed heart diseases at an early age or if you smoke.   Chlamydia, HIV, and other sexual transmitted disease- Get screened each year until the age of 41 then within three months of each new sexual partner.  Diabetes- Have your blood sugar checked regularly if you have high blood pressure, high cholesterol, a family history of diabetes or if you are overweight.  Get these vaccines  Flu shot- Every fall.  Tetanus shot- Every 10 years.  Menactra- Single dose; prevents meningitis.  Take these steps  Don't smoke- If you do smoke, ask your healthcare provider about quitting. For tips on how to quit, go to www.smokefree.gov or call 1-800-QUIT-NOW.  Be physically active- Exercise 5 days a week for at least 30 minutes.  If you are not already physically active start slow and gradually work up to 30 minutes of moderate physical activity.  Examples of moderate activity include walking briskly, mowing the yard, dancing, swimming bicycling, etc.  Eat a healthy diet- Eat a variety of healthy foods  such as fruits, vegetables, low fat milk, low fat cheese, yogurt, lean meats, poultry, fish, beans, tofu, etc.  For more information on healthy eating, go to www.thenutritionsource.org  Drink alcohol in moderation- Limit alcohol intake two drinks or less a day.  Never drink and drive.  Dentist- Brush and floss teeth twice daily; visit your dentis twice a year.  Depression-Your emotional health is as important as your physical health.  If you're feeling down, losing interest in things you normally enjoy please talk with your healthcare provider.  Gun Safety- If you keep a gun in your home, keep it unloaded and with the safety lock on.  Bullets should be stored separately.  Helmet use- Always wear a helmet when riding a motorcycle, bicycle, rollerblading or skateboarding.  Safe sex- If you may be exposed to a sexually transmitted infection, use a condom  Seat belts- Seat bels can save your life; always wear one.  Smoke/Carbon Monoxide detectors- These detectors need to be installed on the appropriate level of your home.  Replace batteries at least once a year.  Skin Cancer- When out in the sun, cover up and use sunscreen SPF 15 or higher.  Violence- If anyone is threatening or hurting you, please tell your healthcare provider.

## 2014-01-01 NOTE — Progress Notes (Addendum)
Subjective:    Patient ID: Casey [name], male    DOB: 07-18-1970, 43 y.o.   MRN: [id number]  This chart was scribed for Ethelda Chick, MD by Gwenevere Abbot, ED scribe. This patient was seen in room Room/bed 22 and the patient's care was started at 1:37 PM.  HPI HPI Comments:  Casey [name] is a 43 y.o. male who presents to [facility] for a physical exam. Pt is unsure of his last physical.    Tetanus: 2008 Colonoscopy: Pt has never needed a colonoscopy. Eye Exam: Pt has not had an exam, and does not wear glasses. Dental Exam: 2012 Flu Shot: Refused  Medical History: Asthma: Diagnosed with Asthma at the age of 56. Pt has not been hospitalized for asthma, and pt has not had to visit ED for asthma in the last 3 to 4 years, however pt uses rescue inhaler 10 times a week. Pt smokes 1-2 cigarettes a day.  HTN: Pt has a h/o of high BP since he was 21 or 22. Pt denies any surgeries, however pt repots that he did have an ulcer in 2006.  Family History: Mother: Deceased at 48 y.o. mother was healthy, but "died of a broken heart." Father: Deceased at 2 y.o.; HTN, 2 CVA, ulcer. Siblings: 1 sister with depression and 1 brother with asthma, ulcer and GI issues.   Social History: Pt married with 2 sons, and lives with wife. Pt does not consume alcohol, or consume illicit drugs. Pt is willing to attempt to quit smoking. Pt is not exercising regularly due to work schedule  Medications: Goody's BC Powder: pt reports that he gets a headache everyday, towards the end of the day. Pt reports that he occassionally wakes up with one, but as he starts moving around the pain subsides. Pt reports that he tries to fight the headache off with rest and relaxation, and if he has to take a [facility], he only takes half. Pt reports that the headaches can be generalized, or right behind the eyes. Pt reports that the headaches can be seasonal. Pt reports that he does experience nasal congestion. Pt reports that he also takes a  Claritin sometimes, with relief.  BP medication: Pt is compliant. Pt reports that he started out with side effects, but those have subsided. Pt can tell when his BP is high because his vision becomes blurry, and he begins to get a headache. Pt reports that his BP is generally better in the morning and gets worse during the day. Pt is still experiencing palpitations, but medication has helped, but he reports that this is his baseline, and that he has seen a cardiologist for this. He reports that the cardiologist did not have any significant findings in regards to the palpations. Pt denies LOC.   Pt medical concerns:  Back Pain: Pt does experience lower back pain, but he attributes it to an accident that occurred when he was younger, along with frequently being on his feet throughout the day due to his occupation.  Blood in stools. Pt reports that it is bright red blood in the toilet, and on the toilet paper. Pt reports that this occurs every time he has a BM. Pt reports that this has been occuring since he started his BP medication. Pt reports that his stools are occasionally dark or burgundy. Pt reports that he does consume a lot of beef.  Frequency: Pt reports that he urinates 1 time a night. Pt denies any other urinary issues.  Pt denies issues with erections.  Insomnia: Pt reports that he generally goes to sleep at approximately 10:30 PM, but he may not fall asleep until 3.  Pt reports that his mind is often racing during this time. Pt denies that he is down and sad. Pt reports that he used to deal with this when he was younger but has learned how to deal with it. Pt reports that he also has issues with anxiety, and that this may interfere with his sleep.    Review of Systems  Constitutional: Negative for fever, chills, diaphoresis, activity change, appetite change, fatigue and unexpected weight change.  HENT: Positive for congestion. Negative for dental problem, drooling, ear discharge, ear pain,  facial swelling, hearing loss, mouth sores, nosebleeds, postnasal drip, rhinorrhea, sinus pressure, sneezing, sore throat, tinnitus, trouble swallowing and voice change.   Eyes: Positive for visual disturbance. Negative for photophobia, pain, discharge, redness and itching.  Respiratory: Positive for shortness of breath and wheezing. Negative for apnea, cough, choking, chest tightness and stridor.   Cardiovascular: Positive for palpitations. Negative for chest pain and leg swelling.  Gastrointestinal: Positive for blood in stool. Negative for nausea, vomiting, abdominal pain, diarrhea, constipation and rectal pain.  Endocrine: Negative for cold intolerance, heat intolerance, polydipsia, polyphagia and polyuria.  Genitourinary: Negative for dysuria, urgency, frequency, hematuria, flank pain, decreased urine volume, discharge, penile swelling, scrotal swelling, enuresis, difficulty urinating, genital sores, penile pain and testicular pain.  Musculoskeletal: Positive for back pain. Negative for arthralgias, gait problem, joint swelling, myalgias, neck pain and neck stiffness.  Skin: Negative for color change, pallor, rash and wound.  Allergic/Immunologic: Negative for environmental allergies, food allergies and immunocompromised state.  Neurological: Positive for headaches. Negative for dizziness, tremors, seizures, syncope, facial asymmetry, speech difficulty, weakness, light-headedness and numbness.  Hematological: Negative for adenopathy. Does not bruise/bleed easily.  Psychiatric/Behavioral: Positive for sleep disturbance. Negative for suicidal ideas, hallucinations, behavioral problems, confusion, self-injury, dysphoric mood, decreased concentration and agitation. The patient is nervous/anxious. The patient is not hyperactive.        Objective:   Physical Exam  Nursing note and vitals reviewed. Constitutional: He is oriented to person, place, and time. He appears well-developed and  well-nourished. No distress.  HENT:  Head: Normocephalic and atraumatic.  Right Ear: External ear normal.  Left Ear: External ear normal.  Nose: Nose normal.  Mouth/Throat: Oropharynx is clear and moist.  Eyes: Conjunctivae and EOM are normal. Pupils are equal, round, and reactive to light.  Neck: Normal range of motion. Neck supple. Carotid bruit is not present. No thyromegaly present.  Cardiovascular: Normal rate, regular rhythm, normal heart sounds and intact distal pulses.  Exam reveals no gallop and no friction rub.   No murmur heard. Frequent PVCs.   Pulmonary/Chest: Effort normal. He has wheezes (Expiratory wheezing throughout.). He has no rales.  Abdominal: Soft. Bowel sounds are normal. He exhibits no distension and no mass. There is no tenderness. There is no rebound and no guarding.  Genitourinary: Prostate normal, testes normal and penis normal. Rectal exam shows external hemorrhoid. Rectal exam shows no internal hemorrhoid, no mass, no tenderness and anal tone normal. Prostate is not enlarged and not tender. Right testis shows no mass, no swelling and no tenderness. Left testis shows no mass, no swelling and no tenderness. Circumcised.  Musculoskeletal: Normal range of motion.       Right shoulder: Normal.       Left shoulder: Normal.       Cervical back: Normal.  Lymphadenopathy:    He has no cervical adenopathy.  Neurological: He is alert and oriented to person, place, and time. He has normal reflexes. No cranial nerve deficit. He exhibits normal muscle tone. Coordination normal.  Skin: Skin is warm and dry. No rash noted. He is not diaphoretic.  Psychiatric: He has a normal mood and affect. His behavior is normal. Judgment and thought content normal.    EKG: NSR; frequent PACs    Assessment & Plan:  Essential hypertension - Plan: POCT urinalysis dipstick, COMPLETE METABOLIC PANEL WITH GFR, EKG 12-Lead, amLODipine (NORVASC) 5 MG tablet  Routine general medical  examination at a health care facility  Screening for prostate cancer - Plan: PSA  Bloody stools - Plan: IFOBT POC (occult bld, rslt in office), Ambulatory referral to Gastroenterology  External hemorrhoids - Plan: Ambulatory referral to Gastroenterology, hydrocortisone (ANUSOL-HC) 25 MG suppository  Screening for diabetes mellitus - Plan: Hemoglobin A1c  Palpitations  Generalized anxiety disorder  Asthma, moderate persistent, uncomplicated   1.  Complete Physical Examination: anticipatory guidance --- weight loss, exercise, smoking cessation.  Pt refused flu vaccine or Pneumovax.   2.  Screening prostate cancer: DRE completed; PSA obtained. 3.  Bloody stools:  New.  With visualized external hemorrhoids; treat with Anusol HC suppositories. Refer to GI for colonoscopy.  Obtain CBC. 4.  External hemorrhoids:  New.  Rx for Anusol [facility] suppositories provided. 5.  Screening DMII: obtain glucose and HgbA1c. 6.  Palpitations; improved with Atenolol but minimally bothersome to patient; desires trial of different blood pressure medication.  Will wean Atenolol over next week.  7.  HTN: moderately controlled; wean Atenolol over upcoming week; start Amlodipine 5mg  daily; follow-up six weeks for recheck. 8. Asthma: uncontrolled; pt to add QVAR.  Smoking cessation encouraged. 9. Allergic Rhinitis: uncontrolled; start Claritin 10mg  daily. 10.  Anxiety: stable at this time; pt declined medication. 11. Insomnia: uncontrolled; pt declined medication at this time.   Meds ordered this encounter  Medications  . amLODipine (NORVASC) 5 MG tablet    Sig: Take 1 tablet (5 mg total) by mouth daily.    Dispense:  30 tablet    Refill:  5  . hydrocortisone (ANUSOL-HC) 25 MG suppository    Sig: Place 1 suppository (25 mg total) rectally 2 (two) times daily.    Dispense:  12 suppository    Refill:  3   I personally performed the services described in this documentation, which was scribed in my presence.  The  recorded information has been reviewed and is accurate.  Nilda Simmer, M.D.  [facility] [address] [place], [place]  [address] [phone] phone [phone] fax

## 2014-01-01 NOTE — Progress Notes (Signed)
   Subjective:    Patient ID: Casey [name], male    DOB: 1970-12-19, 43 y.o.   MRN: [id number]  HPI    Review of Systems  Constitutional: Negative.   HENT: Positive for nosebleeds.   Eyes: Negative.   Respiratory: Negative.   Cardiovascular: Negative.   Gastrointestinal: Positive for abdominal pain, constipation, blood in stool and anal bleeding.  Endocrine: Negative.   Genitourinary: Negative.   Musculoskeletal: Positive for neck stiffness.  Skin: Negative.   Allergic/Immunologic: Negative.   Neurological: Positive for dizziness and headaches.  Hematological: Negative.   Psychiatric/Behavioral: Negative.        Objective:   Physical Exam        Assessment & Plan:

## 2014-01-02 DIAGNOSIS — G4700 Insomnia, unspecified: Secondary | ICD-10-CM | POA: Insufficient documentation

## 2014-01-02 DIAGNOSIS — J302 Other seasonal allergic rhinitis: Secondary | ICD-10-CM | POA: Insufficient documentation

## 2014-01-02 DIAGNOSIS — K644 Residual hemorrhoidal skin tags: Secondary | ICD-10-CM | POA: Insufficient documentation

## 2014-01-02 LAB — COMPLETE METABOLIC PANEL WITH GFR
ALBUMIN: 4.1 g/dL (ref 3.5–5.2)
ALT: 72 U/L — ABNORMAL HIGH (ref 0–53)
AST: 55 U/L — ABNORMAL HIGH (ref 0–37)
Alkaline Phosphatase: 72 U/L (ref 39–117)
BILIRUBIN TOTAL: 0.8 mg/dL (ref 0.2–1.2)
BUN: 11 mg/dL (ref 6–23)
CO2: 23 meq/L (ref 19–32)
Calcium: 9.2 mg/dL (ref 8.4–10.5)
Chloride: 103 mEq/L (ref 96–112)
Creat: 1.14 mg/dL (ref 0.50–1.35)
GFR, EST NON AFRICAN AMERICAN: 80 mL/min
GLUCOSE: 82 mg/dL (ref 70–99)
Potassium: 4.5 mEq/L (ref 3.5–5.3)
SODIUM: 139 meq/L (ref 135–145)
TOTAL PROTEIN: 7 g/dL (ref 6.0–8.3)

## 2014-01-02 LAB — HEMOGLOBIN A1C
Hgb A1c MFr Bld: 6.2 % — ABNORMAL HIGH (ref <5.7)
MEAN PLASMA GLUCOSE: 131 mg/dL — AB (ref <117)

## 2014-01-02 LAB — PSA: PSA: 0.79 ng/mL (ref <=4.00)

## 2014-01-08 ENCOUNTER — Encounter: Payer: Self-pay | Admitting: Gastroenterology

## 2014-01-31 ENCOUNTER — Telehealth: Payer: Self-pay | Admitting: Sports Medicine

## 2014-02-02 NOTE — Telephone Encounter (Signed)
Patient requesting refill on his "ProAir Inhaler". [facility] [address] Patient states he has had trouble breathing and really needs his inhaler. Patients call back number is [phone]

## 2014-02-03 MED ORDER — PROAIR HFA 108 (90 BASE) MCG/ACT IN AERS: 1.0000 | INHALATION_SPRAY | Freq: Four times a day (QID) | RESPIRATORY_TRACT | Status: DC | PRN

## 2014-02-03 NOTE — Telephone Encounter (Signed)
Refill sent to pharmacy. Pt notified. °

## 2014-02-03 NOTE — Addendum Note (Signed)
Addended by: Elease EtienneHANSEN, Icie Kuznicki A on: 02/03/2014 10:37 AM   Modules accepted: Orders

## 2014-03-10 ENCOUNTER — Ambulatory Visit: Payer: BC Managed Care – PPO | Admitting: Gastroenterology

## 2014-03-27 ENCOUNTER — Telehealth: Payer: Self-pay | Admitting: Physician Assistant

## 2014-03-29 NOTE — Telephone Encounter (Signed)
The patient was calling about a medication refill for his inhaler.  He said he called the pharmacy, and they told him to call back here to inquire about getting a refill sent to them.  The patient requests a call back after the prescription has been sent to the pharmacy.  Thank you.  CB#: [phone]

## 2014-03-31 NOTE — Telephone Encounter (Signed)
Sent in RFs and LMOM for pt advising and also instructing pt that he should come back in for re-check on asthma if he is having to use his inhaler so often that he is having to RF every month.

## 2014-04-14 ENCOUNTER — Ambulatory Visit: Payer: BC Managed Care – PPO | Admitting: Gastroenterology

## 2014-05-12 ENCOUNTER — Ambulatory Visit: Payer: Self-pay | Admitting: Gastroenterology

## 2014-06-04 ENCOUNTER — Other Ambulatory Visit: Payer: Self-pay | Admitting: Physician Assistant

## 2014-06-29 ENCOUNTER — Other Ambulatory Visit: Payer: Self-pay | Admitting: Family Medicine

## 2014-07-14 ENCOUNTER — Other Ambulatory Visit: Payer: Self-pay | Admitting: Physician Assistant

## 2014-08-02 ENCOUNTER — Telehealth: Payer: Self-pay

## 2014-08-03 ENCOUNTER — Ambulatory Visit (INDEPENDENT_AMBULATORY_CARE_PROVIDER_SITE_OTHER): Payer: 59 | Admitting: Physician Assistant

## 2014-08-03 ENCOUNTER — Ambulatory Visit: Payer: 59

## 2014-08-03 VITALS — BP 122/90 | HR 81 | Temp 97.9°F | Resp 16 | Ht 68.5 in | Wt 192.6 lb

## 2014-08-03 DIAGNOSIS — I10 Essential (primary) hypertension: Secondary | ICD-10-CM | POA: Diagnosis not present

## 2014-08-03 MED ORDER — AMLODIPINE BESYLATE 5 MG PO TABS: 5.0000 mg | ORAL_TABLET | Freq: Every day | ORAL | Status: DC

## 2014-08-03 NOTE — Progress Notes (Signed)
   Subjective:    Patient ID: Casey [name], male    DOB: 1971/02/15, 44 y.o.   MRN: [id number]  HPI Patient presents for refill of amlodipine 5 mg for dx of HTN. Has been taking this medication at this dose for approx. 5 months. States that has done well on amlodipine and home BP is usually 120s/80-90s. Has been compliant with medication and denies orthopnea, dizziness, or N/V. Additionally denies SOB/CP, edema, palpitations, HA, or change in vision. Has been trying to make changes with his diet by drinking more water and eating more salad. Has tried to eat less red meat, but is struggling with that the most. Has also cut back on sodas. Is having a difficult time implementing exercise into daily life as he works long hours, but desires to do so. Currently smokes daily. Not ready to quit yet.   Review of Systems  Constitutional: Negative for activity change and appetite change.  Respiratory: Negative for cough, shortness of breath and wheezing.   Cardiovascular: Negative for chest pain, palpitations and leg swelling.  Gastrointestinal: Negative for nausea and vomiting.  Neurological: Negative for dizziness, light-headedness and headaches.       Objective:   Physical Exam  Constitutional: He is oriented to person, place, and time. He appears well-developed and well-nourished. No distress.  Blood pressure 122/90, pulse 81, temperature 97.9 F (36.6 C), temperature source Oral, resp. rate 16, height 5' 8.5" (1.74 m), weight 192 lb 9.6 oz (87.363 kg), SpO2 97 %.  HENT:  Head: Normocephalic and atraumatic.  Right Ear: External ear normal.  Left Ear: External ear normal.  Eyes: Conjunctivae are normal. Pupils are equal, round, and reactive to light. Right eye exhibits no discharge. Left eye exhibits no discharge. No scleral icterus.  Neck: Neck supple. No JVD present. No thyromegaly present.  Cardiovascular: Normal rate, regular rhythm, normal heart sounds and intact distal pulses.  Exam  reveals no gallop and no friction rub.   No murmur heard. Pulmonary/Chest: Effort normal and breath sounds normal. No respiratory distress. He has no wheezes. He has no rales.  Abdominal: Soft. Bowel sounds are normal. He exhibits no distension. There is no tenderness. There is no rebound and no guarding.  Musculoskeletal: He exhibits no edema.  Lymphadenopathy:    He has no cervical adenopathy.  Neurological: He is alert and oriented to person, place, and time.  Skin: Skin is warm and dry. No rash noted. He is not diaphoretic. No erythema.   Wt Readings from Last 3 Encounters:  08/03/14 192 lb 9.6 oz (87.363 kg)  01/01/14 182 lb 12.8 oz (82.918 kg)  11/14/13 182 lb (82.555 kg)      Assessment & Plan:  1. Essential hypertension Doing well on current dose of medication. Discussed lifestyle modifications and quitting smoking. Not ready to quit currently.  - amLODipine (NORVASC) 5 MG tablet; Take 1 tablet (5 mg total) by mouth daily.  Dispense: 30 tablet; Refill: 3   Annais Crafts PA-C  [facility] 08/03/2014 9:24 AM

## 2014-08-08 ENCOUNTER — Other Ambulatory Visit: Payer: Self-pay | Admitting: Physician Assistant

## 2014-08-09 ENCOUNTER — Telehealth: Payer: Self-pay | Admitting: *Deleted

## 2014-08-09 ENCOUNTER — Other Ambulatory Visit: Payer: Self-pay | Admitting: *Deleted

## 2014-08-09 DIAGNOSIS — J45909 Unspecified asthma, uncomplicated: Secondary | ICD-10-CM

## 2014-08-09 MED ORDER — PROAIR HFA 108 (90 BASE) MCG/ACT IN AERS: 1.0000 | INHALATION_SPRAY | Freq: Four times a day (QID) | RESPIRATORY_TRACT | Status: DC | PRN

## 2014-08-09 NOTE — Telephone Encounter (Signed)
Spoke with patient he was advised this is the last refill for pro-air inhaler without an office visit.  Patient understood.

## 2014-08-25 ENCOUNTER — Other Ambulatory Visit: Payer: Self-pay | Admitting: Family Medicine

## 2014-09-16 ENCOUNTER — Ambulatory Visit (INDEPENDENT_AMBULATORY_CARE_PROVIDER_SITE_OTHER): Payer: 59 | Admitting: Family Medicine

## 2014-09-16 VITALS — BP 132/84 | HR 77 | Temp 98.3°F | Resp 17 | Ht 69.0 in | Wt 191.0 lb

## 2014-09-16 DIAGNOSIS — J45909 Unspecified asthma, uncomplicated: Secondary | ICD-10-CM | POA: Diagnosis not present

## 2014-09-16 MED ORDER — BECLOMETHASONE DIPROPIONATE 80 MCG/ACT IN AERS: 1.0000 | INHALATION_SPRAY | Freq: Two times a day (BID) | RESPIRATORY_TRACT | Status: DC

## 2014-09-16 MED ORDER — PREDNISONE 20 MG PO TABS: ORAL_TABLET | ORAL | Status: DC

## 2014-09-16 MED ORDER — PROAIR HFA 108 (90 BASE) MCG/ACT IN AERS: 1.0000 | INHALATION_SPRAY | Freq: Four times a day (QID) | RESPIRATORY_TRACT | Status: DC | PRN

## 2014-09-16 NOTE — Patient Instructions (Signed)

## 2014-09-16 NOTE — Progress Notes (Signed)
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  His asthma has gotten worse lately.  He has had asthma since childhood.  Objective: BP 132/84 mmHg  Pulse 77  Temp(Src) 98.3 F (36.8 C) (Oral)  Resp 17  Ht 5\' 9"  (1.753 m)  Wt 191 lb (86.637 kg)  BMI 28.19 kg/m2  SpO2 97% Audibly wheezing. HEENT:  NAD Chest: diffuse insp and exp wheezes Heart: reg, no murmur  Assessment:  This chart was scribed in my presence and reviewed by me personally.    ICD-9-CM ICD-10-CM   1. Asthma, unspecified asthma severity, uncomplicated 493.90 J45.909 PROAIR HFA 108 (90 BASE) MCG/ACT inhaler     beclomethasone (QVAR) 80 MCG/ACT inhaler     predniSONE (DELTASONE) 20 MG tablet     Signed, Elvina Sidle, MD

## 2014-12-02 ENCOUNTER — Other Ambulatory Visit: Payer: Self-pay | Admitting: Physician Assistant

## 2014-12-30 ENCOUNTER — Other Ambulatory Visit: Payer: Self-pay | Admitting: Physician Assistant

## 2015-01-31 ENCOUNTER — Other Ambulatory Visit: Payer: Self-pay | Admitting: Family Medicine

## 2015-02-23 ENCOUNTER — Other Ambulatory Visit: Payer: Self-pay | Admitting: Family Medicine

## 2015-02-24 ENCOUNTER — Ambulatory Visit (INDEPENDENT_AMBULATORY_CARE_PROVIDER_SITE_OTHER): Payer: 59 | Admitting: Family Medicine

## 2015-02-24 ENCOUNTER — Ambulatory Visit (INDEPENDENT_AMBULATORY_CARE_PROVIDER_SITE_OTHER): Payer: 59

## 2015-02-24 VITALS — BP 140/90 | HR 110 | Temp 98.8°F | Resp 24 | Ht 69.0 in

## 2015-02-24 DIAGNOSIS — R0902 Hypoxemia: Secondary | ICD-10-CM

## 2015-02-24 DIAGNOSIS — R0600 Dyspnea, unspecified: Secondary | ICD-10-CM

## 2015-02-24 DIAGNOSIS — J4541 Moderate persistent asthma with (acute) exacerbation: Secondary | ICD-10-CM

## 2015-02-24 DIAGNOSIS — R42 Dizziness and giddiness: Secondary | ICD-10-CM | POA: Diagnosis not present

## 2015-02-24 DIAGNOSIS — J45909 Unspecified asthma, uncomplicated: Secondary | ICD-10-CM

## 2015-02-24 LAB — POCT CBC
Granulocyte percent: 75 %G (ref 37–80)
HEMATOCRIT: 49.8 % (ref 43.5–53.7)
Hemoglobin: 17.1 g/dL (ref 14.1–18.1)
LYMPH, POC: 1.9 (ref 0.6–3.4)
MCH, POC: 28.6 pg (ref 27–31.2)
MCHC: 34.2 g/dL (ref 31.8–35.4)
MCV: 83.4 fL (ref 80–97)
MID (cbc): 0.6 (ref 0–0.9)
MPV: 7.7 fL (ref 0–99.8)
POC GRANULOCYTE: 7.5 — AB (ref 2–6.9)
POC LYMPH %: 18.8 % (ref 10–50)
POC MID %: 6.2 % (ref 0–12)
Platelet Count, POC: 180 10*3/uL (ref 142–424)
RBC: 5.97 M/uL (ref 4.69–6.13)
RDW, POC: 14.7 %
WBC: 10 10*3/uL (ref 4.6–10.2)

## 2015-02-24 MED ORDER — IPRATROPIUM BROMIDE 0.02 % IN SOLN
0.5000 mg | Freq: Once | RESPIRATORY_TRACT | Status: AC
  Administered 2015-02-24: 0.5 mg via RESPIRATORY_TRACT

## 2015-02-24 MED ORDER — PREDNISONE 20 MG PO TABS: ORAL_TABLET | ORAL | Status: DC

## 2015-02-24 MED ORDER — METHYLPREDNISOLONE SODIUM SUCC 125 MG IJ SOLR
125.0000 mg | Freq: Once | INTRAMUSCULAR | Status: AC
  Administered 2015-02-24: 125 mg via INTRAMUSCULAR

## 2015-02-24 MED ORDER — ALBUTEROL SULFATE (2.5 MG/3ML) 0.083% IN NEBU: 2.5000 mg | INHALATION_SOLUTION | Freq: Four times a day (QID) | RESPIRATORY_TRACT | Status: DC | PRN

## 2015-02-24 MED ORDER — ALBUTEROL SULFATE (2.5 MG/3ML) 0.083% IN NEBU
2.5000 mg | INHALATION_SOLUTION | Freq: Once | RESPIRATORY_TRACT | Status: AC
  Administered 2015-02-24: 2.5 mg via RESPIRATORY_TRACT

## 2015-02-24 MED ORDER — BECLOMETHASONE DIPROPIONATE 40 MCG/ACT IN AERS: 2.0000 | INHALATION_SPRAY | Freq: Two times a day (BID) | RESPIRATORY_TRACT | Status: DC

## 2015-02-24 NOTE — Progress Notes (Signed)
Patient ID: Casey [name], male    DOB: 1970/08/26  Age: 44 y.o. MRN: [id number]  Chief Complaint  Patient presents with  . Asthma    having asthma attack, wife said it first started two days ago     Subjective:   44 year old man with a history of asthma. He uses albuterol inhalers. He has a history of prior attacks but it is been some time since he has had a bad attack. He has been fighting it for 2 days at home because he did not want to come in. He does not smoke. He uses an albuterol inhaler at home. He has had a steroid inhaler in the past but is not using that. He has been having some chills. He has coughed up some phlegm. He had a terrible night overnight last night.  No upper respiratory infection. No chest pains. He has some nausea but no vomiting. Otherwise no GI or GU complaints. Rest of review of systems is unremarkable.  Current allergies, medications, problem list, past/family and social histories reviewed.  Objective:  BP 140/90 mmHg  Pulse 110  Temp(Src) 98.8 F (37.1 C) (Oral)  Resp 24  Ht  (1.753 m)  SpO2 94%  PF 260 L/min  I was called to see the patient emergently because he was breathing so badly. He was immediately given a nebulizer treatment. He had slight wheezing with poor air movement. O2 saturation was in the low 90s. 89-92. He was not cyanotic. He was splinting to breathe. His wife accompanied him. Throat clear. Neck supple without nodes. Chest tight wheezing. Heart regular without murmurs gallops or arrhythmias, is tachycardic. Abdomen soft. No edema.  Patient was given nebulizer treatments 3 times. First time with albuterol and Atrovent, second time with just albuterol, and third time with both. This was done over a two-hour period. He was repeatedly checked, spending a total of about 1 hour with the patient. The patient was kept in for the nursing station where he could be monitored closely. He was able to drink fluids well, so was not given IV. He  received 125 mg of Solu-Medrol IM. He felt better. At times he was unable to move the peak flow meter at times, but got to where he could blow 250-260. He was still very tight. He was checked repeatedly.  The patient did have some lightheadedness, especially right after the albuterol treatment. However before he was able to be discharged I ambulated him up and down the hall. Although very short of breath, he was able to tolerate that. Pulse ox falls with activity to 89, but then came back up to 94.  Although still wheezing, he was clear enough that he felt like he could go home. His wife agreed to take him home if he would go to the emergency room with her if he got at all worse. He admits willingness to let her call the shots on that. O2 saturation was still 91-94 at the time that he left. He could cough up white mucus, no purulent.  [facility] reading (PRIMARY) by  Dr. Alwyn Ren Prominent markings.  Does not appear to have acute infiltrates..   Results for orders placed or performed in visit on 02/24/15  POCT CBC  Result Value Ref Range   WBC 10.0 4.6 - 10.2 K/uL   Lymph, poc 1.9 0.6 - 3.4   POC LYMPH PERCENT 18.8 10 - 50 %L   MID (cbc) 0.6 0 - 0.9   POC MID % 6.2  0 - 12 %M   POC Granulocyte 7.5 (A) 2 - 6.9   Granulocyte percent 75.0 37 - 80 %G   RBC 5.97 4.69 - 6.13 M/uL   Hemoglobin 17.1 14.1 - 18.1 g/dL   HCT, POC 56.349.8 87.543.5 - 53.7 %   MCV 83.4 80 - 97 fL   MCH, POC 28.6 27 - 31.2 pg   MCHC 34.2 31.8 - 35.4 g/dL   RDW, POC 64.314.7 %   Platelet Count, POC 180 142 - 424 K/uL   MPV 7.7 0 - 99.8 fL    Assessment & Plan:   Assessment: 1. Asthma, unspecified asthma severity, uncomplicated   2. Asthma with exacerbation, moderate persistent   3. Dyspnea   4. Hypoxemia   5. Lightheadedness       Plan: Treat with nebulizer treatments and decide whether we proceed from there.  Orders Placed This Encounter  Procedures  . DG Chest 2 View    Order Specific Question:  Reason for Exam  (SYMPTOM  OR DIAGNOSIS REQUIRED)    Answer:  asthma    Order Specific Question:  Preferred imaging location?    Answer:  External  . POCT CBC    Meds ordered this encounter  Medications  . albuterol (PROVENTIL) (2.5 MG/3ML) 0.083% nebulizer solution 2.5 mg    Sig:   . ipratropium (ATROVENT) nebulizer solution 0.5 mg    Sig:   . methylPREDNISolone sodium succinate (SOLU-MEDROL) 125 mg/2 mL injection 125 mg    Sig:   . albuterol (PROVENTIL) (2.5 MG/3ML) 0.083% nebulizer solution 2.5 mg    Sig:   . albuterol (PROVENTIL) (2.5 MG/3ML) 0.083% nebulizer solution 2.5 mg    Sig:   . ipratropium (ATROVENT) nebulizer solution 0.5 mg    Sig:   . beclomethasone (QVAR) 40 MCG/ACT inhaler    Sig: Inhale 2 puffs into the lungs 2 (two) times daily.    Dispense:  1 Inhaler    Refill:  12  . albuterol (PROVENTIL) (2.5 MG/3ML) 0.083% nebulizer solution    Sig: Take 3 mLs (2.5 mg total) by nebulization every 6 (six) hours as needed for wheezing or shortness of breath.    Dispense:  75 mL    Refill:  3  . predniSONE (DELTASONE) 20 MG tablet    Sig: Take 3 daily for 2 days, then 2 daily for 2 days, then 1 daily for 2 days, then one half daily    Dispense:  14 tablet    Refill:  0         Patient Instructions  You have already received a shot of cortisone today. Beginning tomorrow morning take prednisone 3 daily for 2 days, then 2 daily for 2 days, then 1 daily for 2 days, then one half daily for 4 days  Drink plenty of fluids  Use the albuterol nebulizer or inhaler every 3-4 hours for the next 24 hours, then do not exceed more often than every 4 hours  Take the Qvar 40 inhaler 2 inhalations twice daily morning and evening  Return in 3-4 weeks for a baseline check for your asthma and to decide longer-term treatment  If you do not continue to improve by this evening go on to the emergency room. If you aren't worse at anytime go on to the emergency room. Listen to what your wife decides on  this. Call 911 if acutely short of breath at anytime.       Return in about 3 weeks (around 03/17/2015).  Dontell Mian, MD 02/24/2015

## 2015-02-24 NOTE — Patient Instructions (Signed)
You have already received a shot of cortisone today. Beginning tomorrow morning take prednisone 3 daily for 2 days, then 2 daily for 2 days, then 1 daily for 2 days, then one half daily for 4 days  Drink plenty of fluids  Use the albuterol nebulizer or inhaler every 3-4 hours for the next 24 hours, then do not exceed more often than every 4 hours  Take the Qvar 40 inhaler 2 inhalations twice daily morning and evening  Return in 3-4 weeks for a baseline check for your asthma and to decide longer-term treatment  If you do not continue to improve by this evening go on to the emergency room. If you aren't worse at anytime go on to the emergency room. Listen to what your wife decides on this. Call 911 if acutely short of breath at anytime.

## 2015-05-04 ENCOUNTER — Other Ambulatory Visit: Payer: Self-pay | Admitting: Physician Assistant

## 2015-05-05 ENCOUNTER — Telehealth: Payer: Self-pay

## 2015-05-05 ENCOUNTER — Other Ambulatory Visit: Payer: Self-pay | Admitting: Physician Assistant

## 2015-05-05 NOTE — Telephone Encounter (Signed)
Patient states that he needs a refill on his BP medication. Please send it to [facility] on [address].  [phone]

## 2015-05-06 MED ORDER — AMLODIPINE BESYLATE 5 MG PO TABS: 5.0000 mg | ORAL_TABLET | Freq: Every day | ORAL | Status: DC

## 2015-05-06 NOTE — Telephone Encounter (Signed)
Called pt and advised he is overdue for BP f/up and labs. He had not gotten notices from pharm on last RFs, but set up appt for next available w/Sarah on 06/25/15. I sent in RFs x 2 mos until then.

## 2015-05-06 NOTE — Telephone Encounter (Signed)
Called [facility] back and cancelled Rx. Pt asked me to send RFs to another pharm.

## 2015-06-25 ENCOUNTER — Ambulatory Visit: Payer: Self-pay | Admitting: Physician Assistant

## 2015-06-30 ENCOUNTER — Ambulatory Visit (INDEPENDENT_AMBULATORY_CARE_PROVIDER_SITE_OTHER): Payer: BLUE CROSS/BLUE SHIELD | Admitting: Family Medicine

## 2015-06-30 ENCOUNTER — Encounter: Payer: Self-pay | Admitting: Family Medicine

## 2015-06-30 VITALS — BP 120/100 | HR 68 | Temp 98.2°F | Resp 16 | Ht 68.5 in | Wt 182.6 lb

## 2015-06-30 DIAGNOSIS — R21 Rash and other nonspecific skin eruption: Secondary | ICD-10-CM

## 2015-06-30 DIAGNOSIS — Z131 Encounter for screening for diabetes mellitus: Secondary | ICD-10-CM

## 2015-06-30 DIAGNOSIS — J4540 Moderate persistent asthma, uncomplicated: Secondary | ICD-10-CM

## 2015-06-30 DIAGNOSIS — K921 Melena: Secondary | ICD-10-CM | POA: Diagnosis not present

## 2015-06-30 DIAGNOSIS — I10 Essential (primary) hypertension: Secondary | ICD-10-CM | POA: Diagnosis not present

## 2015-06-30 LAB — CBC
HCT: 47.3 % (ref 39.0–52.0)
HEMOGLOBIN: 16.3 g/dL (ref 13.0–17.0)
MCH: 29.1 pg (ref 26.0–34.0)
MCHC: 34.5 g/dL (ref 30.0–36.0)
MCV: 84.5 fL (ref 78.0–100.0)
MPV: 10.4 fL (ref 8.6–12.4)
PLATELETS: 189 10*3/uL (ref 150–400)
RBC: 5.6 MIL/uL (ref 4.22–5.81)
RDW: 14.3 % (ref 11.5–15.5)
WBC: 6.5 10*3/uL (ref 4.0–10.5)

## 2015-06-30 LAB — POCT SKIN KOH: Skin KOH, POC: NEGATIVE

## 2015-06-30 MED ORDER — FLUTICASONE PROPIONATE HFA 110 MCG/ACT IN AERO: 2.0000 | INHALATION_SPRAY | Freq: Two times a day (BID) | RESPIRATORY_TRACT | Status: DC

## 2015-06-30 MED ORDER — VENTOLIN HFA 108 (90 BASE) MCG/ACT IN AERS: 2.0000 | INHALATION_SPRAY | RESPIRATORY_TRACT | Status: DC | PRN

## 2015-06-30 MED ORDER — AMLODIPINE BESYLATE 5 MG PO TABS: 5.0000 mg | ORAL_TABLET | Freq: Every day | ORAL | Status: DC

## 2015-06-30 NOTE — Progress Notes (Signed)
Subjective:    Patient ID: Casey [name], male    DOB: 05-27-1970, 45 y.o.   MRN: [id number]  HPI This is a pleasant 45 yo male who presents today for follow up of HTN. He is married with two college age boys. He is taking amlodipine without side effects. Occasionally checks BP at pharmacy. Runs 120/80s.   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 Wears supportive shoes, stands on anti-fatigue mat. Stretches most days, little core exercises. Occasional BC for pain, some relief. Some relief with bath.   Has history of blood in stools and had referral to gi. He did not keep appointment. He has occasional blood with straining, hemorrhoid flare a couple times per year. He denies melena.   Rash on scalp for several months. Itchy and flaky. He is concerned about hair loss, but has not noticed any.   Needs refill of albuterol. Was previously on daily maintenance therapy for asthma, not sure if it helped. Has some tightness and wheezing, worse seasonally with allergies.   Past Medical History  Diagnosis Date  . Asthma     diagnosed in childhood; age 89.  No hospitalizations.    . Hypertension     onset age 45.  [place] Kitchen. Anemia   No past surgical history on file. Family History  Problem Relation Age of Onset  . Hypertension Father   . Stroke Father [id number]    CVA; recurrent CVA at time of death  . Asthma Brother    Social History  Substance Use Topics  . Smoking status: Former Smoker    Quit date: 05/05/2012  . Smokeless tobacco: None  . Alcohol Use: 0.0 oz/week    0 Standard drinks or equivalent per week    Review of Systems  Constitutional: Negative for fever, appetite change, fatigue and unexpected weight change.  Respiratory: Positive for chest tightness and wheezing. Negative for cough.   Cardiovascular: Negative for chest pain, palpitations and leg swelling.  Gastrointestinal: Negative for abdominal pain,  diarrhea, constipation and blood in stool.  Musculoskeletal: Positive for back pain.  Allergic/Immunologic: Positive for environmental allergies.       Objective:   Physical Exam Physical Exam  Constitutional: Oriented to person, place, and time. He appears well-developed and well-nourished.  HENT:  Head: Normocephalic and atraumatic.  Eyes: Conjunctivae are normal.  Neck: Normal range of motion. Neck supple.  Cardiovascular: Normal rate, regular rhythm and normal heart sounds.   Pulmonary/Chest: Effort normal and breath sounds normal.  Musculoskeletal: Normal range of motion.  Neurological: Alert and oriented to person, place, and time.  Skin: Skin is warm and dry. left side of scalp above ear with 3 cm round, white flaky patch.  Psychiatric: Normal mood and affect. Behavior is normal. Judgment and thought content normal.  Vitals reviewed.     BP 120/100 mmHg  Pulse 68  Temp(Src) 98.2 F (36.8 C) (Oral)  Resp 16  Ht 5' 8.5" (1.74 m)  Wt 182 lb 9.6 oz (82.827 kg)  BMI 27.36 kg/m2  SpO2 95% Wt Readings from Last 3 Encounters:  06/30/15 182 lb 9.6 oz (82.827 kg)  09/16/14 191 lb (86.637 kg)  08/03/14 192 lb 9.6 oz (87.363 kg)  PF 300, albuterol 2 puff then PF 400 Recheck BP 130/92    Assessment & Plan:  1. Essential hypertension -  - COMPLETE METABOLIC PANEL WITH GFR - amLODipine (NORVASC) 5 MG tablet; Take 1  tablet (5 mg total) by mouth daily.  Dispense: 90 tablet; Refill: 1  2. Asthma, moderate persistent, uncomplicated - fluticasone (FLOVENT HFA) 110 MCG/ACT inhaler; Inhale 2 puffs into the lungs 2 (two) times daily.  Dispense: 1 Inhaler; Refill: 12 - VENTOLIN HFA 108 (90 Base) MCG/ACT inhaler; Inhale 2 puffs into the lungs every 4 (four) hours as needed for wheezing or shortness of breath.  Dispense: 3 Inhaler; Refill: 1 - discussed need for maintenance treatment and reduced reliance on albuterol - add long acting antihistamine  3. Blood in stool - None  recently, will check CBC - CBC  4. Screening for diabetes mellitus - Hemoglobin A1c  5. Rash, skin - POCT Skin KOH - clobetasol (TEMOVATE) 0.05 % external solution; Apply 1 application topically 2 (two) times daily. For maximum 10 days.  Dispense: 50 mL; Refill: 0  6. Back pain - provided exercises and encouraged supportive shoes, core strengthening  - follow up in 6 months for CPE, sooner if worsening symptoms Olean Ree, FNP-BC  [facility], [facility]  07/03/2015 1:11 PM

## 2015-06-30 NOTE — Patient Instructions (Addendum)
Give [place] a call July/August for a complete physical in September Please take an over the counter antihistamine like Zyrtec, Allegra or Claritin- generic is fine  Low Back Sprain With Rehab A sprain is an injury in which a ligament is torn. The ligaments of the lower back are vulnerable to sprains. However, they are strong and require great force to be injured. These ligaments are important for stabilizing the spinal column. Sprains are classified into three categories. Grade 1 sprains cause pain, but the tendon is not lengthened. Grade 2 sprains include a lengthened ligament, due to the ligament being stretched or partially ruptured. With grade 2 sprains there is still function, although the function may be decreased. Grade 3 sprains involve a complete tear of the tendon or muscle, and function is usually impaired. SYMPTOMS   Severe pain in the lower back.  Sometimes, a feeling of a "pop," "snap," or tear, at the time of injury.  Tenderness and sometimes swelling at the injury site.  Uncommonly, bruising (contusion) within 48 hours of injury.  Muscle spasms in the back. CAUSES  Low back sprains occur when a force is placed on the ligaments that is greater than they can handle. Common causes of injury include:  Performing a stressful act while off-balance.  Repetitive stressful activities that involve movement of the lower back.  Direct hit (trauma) to the lower back. RISK INCREASES WITH:  Contact sports (football, wrestling).  Collisions (major skiing accidents).  Sports that require throwing or lifting (baseball, weightlifting).  Sports involving twisting of the spine (gymnastics, diving, tennis, golf).  Poor strength and flexibility.  Inadequate protection.  Previous back injury or surgery (especially fusion). PREVENTION  Wear properly fitted and padded protective equipment.  Warm up and stretch properly before activity.  Allow for adequate recovery between  workouts.  Maintain physical fitness:  Strength, flexibility, and endurance.  Cardiovascular fitness.  Maintain a healthy body weight. PROGNOSIS  If treated properly, low back sprains usually heal with non-surgical treatment. The length of time for healing depends on the severity of the injury.  RELATED COMPLICATIONS   Recurring symptoms, resulting in a chronic problem.  Chronic inflammation and pain in the low back.  Delayed healing or resolution of symptoms, especially if activity is resumed too soon.  Prolonged impairment.  Unstable or arthritic joints of the low back. TREATMENT  Treatment first involves the use of ice and medicine, to reduce pain and inflammation. The use of strengthening and stretching exercises may help reduce pain with activity. These exercises may be performed at home or with a therapist. Severe injuries may require referral to a therapist for further evaluation and treatment, such as ultrasound. Your caregiver may advise that you wear a back brace or corset, to help reduce pain and discomfort. Often, prolonged bed rest results in greater harm then benefit. Corticosteroid injections may be recommended. However, these should be reserved for the most serious cases. It is important to avoid using your back when lifting objects. At night, sleep on your back on a firm mattress, with a pillow placed under your knees. If non-surgical treatment is unsuccessful, surgery may be needed.  MEDICATION   If pain medicine is needed, nonsteroidal anti-inflammatory medicines (aspirin and ibuprofen), or other minor pain relievers (acetaminophen), are often advised.  Do not take pain medicine for 7 days before surgery.  Prescription pain relievers may be given, if your caregiver thinks they are needed. Use only as directed and only as much as you need.  Ointments  applied to the skin may be helpful.  Corticosteroid injections may be given by your caregiver. These injections  should be reserved for the most serious cases, because they may only be given a certain number of times. HEAT AND COLD  Cold treatment (icing) should be applied for 10 to 15 minutes every 2 to 3 hours for inflammation and pain, and immediately after activity that aggravates your symptoms. Use ice packs or an ice massage.  Heat treatment may be used before performing stretching and strengthening activities prescribed by your caregiver, physical therapist, or athletic trainer. Use a heat pack or a warm water soak. SEEK MEDICAL CARE IF:   Symptoms get worse or do not improve in 2 to 4 weeks, despite treatment.  You develop numbness or weakness in either leg.  You lose bowel or bladder function.  Any of the following occur after surgery: fever, increased pain, swelling, redness, drainage of fluids, or bleeding in the affected area.  New, unexplained symptoms develop. (Drugs used in treatment may produce side effects.) EXERCISES  RANGE OF MOTION (ROM) AND STRETCHING EXERCISES - Low Back Sprain Most people with lower back pain will find that their symptoms get worse with excessive bending forward (flexion) or arching at the lower back (extension). The exercises that will help resolve your symptoms will focus on the opposite motion.  Your physician, physical therapist or athletic trainer will help you determine which exercises will be most helpful to resolve your lower back pain. Do not complete any exercises without first consulting with your caregiver. Discontinue any exercises which make your symptoms worse, until you speak to your caregiver. If you have pain, numbness or tingling which travels down into your buttocks, leg or foot, the goal of the therapy is for these symptoms to move closer to your back and eventually resolve. Sometimes, these leg symptoms will get better, but your lower back pain may worsen. This is often an indication of progress in your rehabilitation. Be very alert to any  changes in your symptoms and the activities in which you participated in the 24 hours prior to the change. Sharing this information with your caregiver will allow him or her to most efficiently treat your condition. These exercises may help you when beginning to rehabilitate your injury. Your symptoms may resolve with or without further involvement from your physician, physical therapist or athletic trainer. While completing these exercises, remember:   Restoring tissue flexibility helps normal motion to return to the joints. This allows healthier, less painful movement and activity.  An effective stretch should be held for at least 30 seconds.  A stretch should never be painful. You should only feel a gentle lengthening or release in the stretched tissue. FLEXION RANGE OF MOTION AND STRETCHING EXERCISES: STRETCH - Flexion, Single Knee to Chest   Lie on a firm bed or floor with both legs extended in front of you.  Keeping one leg in contact with the floor, bring your opposite knee to your chest. Hold your leg in place by either grabbing behind your thigh or at your knee.  Pull until you feel a gentle stretch in your low back. Hold __________ seconds.  Slowly release your grasp and repeat the exercise with the opposite side. Repeat __________ times. Complete this exercise __________ times per day.  STRETCH - Flexion, Double Knee to Chest  Lie on a firm bed or floor with both legs extended in front of you.  Keeping one leg in contact with the floor, bring your  opposite knee to your chest.  Tense your stomach muscles to support your back and then lift your other knee to your chest. Hold your legs in place by either grabbing behind your thighs or at your knees.  Pull both knees toward your chest until you feel a gentle stretch in your low back. Hold __________ seconds.  Tense your stomach muscles and slowly return one leg at a time to the floor. Repeat __________ times. Complete this  exercise __________ times per day.  STRETCH - Low Trunk Rotation  Lie on a firm bed or floor. Keeping your legs in front of you, bend your knees so they are both pointed toward the ceiling and your feet are flat on the floor.  Extend your arms out to the side. This will stabilize your upper body by keeping your shoulders in contact with the floor.  Gently and slowly drop both knees together to one side until you feel a gentle stretch in your low back. Hold for __________ seconds.  Tense your stomach muscles to support your lower back as you bring your knees back to the starting position. Repeat the exercise to the other side. Repeat __________ times. Complete this exercise __________ times per day  EXTENSION RANGE OF MOTION AND FLEXIBILITY EXERCISES: STRETCH - Extension, Prone on Elbows   Lie on your stomach on the floor, a bed will be too soft. Place your palms about shoulder width apart and at the height of your head.  Place your elbows under your shoulders. If this is too painful, stack pillows under your chest.  Allow your body to relax so that your hips drop lower and make contact more completely with the floor.  Hold this position for __________ seconds.  Slowly return to lying flat on the floor. Repeat __________ times. Complete this exercise __________ times per day.  RANGE OF MOTION - Extension, Prone Press Ups  Lie on your stomach on the floor, a bed will be too soft. Place your palms about shoulder width apart and at the height of your head.  Keeping your back as relaxed as possible, slowly straighten your elbows while keeping your hips on the floor. You may adjust the placement of your hands to maximize your comfort. As you gain motion, your hands will come more underneath your shoulders.  Hold this position __________ seconds.  Slowly return to lying flat on the floor. Repeat __________ times. Complete this exercise __________ times per day.  RANGE OF MOTION- Quadruped,  Neutral Spine   Assume a hands and knees position on a firm surface. Keep your hands under your shoulders and your knees under your hips. You may place padding under your knees for comfort.  Drop your head and point your tailbone toward the ground below you. This will round out your lower back like an angry cat. Hold this position for __________ seconds.  Slowly lift your head and release your tail bone so that your back sags into a large arch, like an old horse.  Hold this position for __________ seconds.  Repeat this until you feel limber in your low back.  Now, find your "sweet spot." This will be the most comfortable position somewhere between the two previous positions. This is your neutral spine. Once you have found this position, tense your stomach muscles to support your low back.  Hold this position for __________ seconds. Repeat __________ times. Complete this exercise __________ times per day.  STRENGTHENING EXERCISES - Low Back Sprain These exercises may help  you when beginning to rehabilitate your injury. These exercises should be done near your "sweet spot." This is the neutral, low-back arch, somewhere between fully rounded and fully arched, that is your least painful position. When performed in this safe range of motion, these exercises can be used for people who have either a flexion or extension based injury. These exercises may resolve your symptoms with or without further involvement from your physician, physical therapist or athletic trainer. While completing these exercises, remember:   Muscles can gain both the endurance and the strength needed for everyday activities through controlled exercises.  Complete these exercises as instructed by your physician, physical therapist or athletic trainer. Increase the resistance and repetitions only as guided.  You may experience muscle soreness or fatigue, but the pain or discomfort you are trying to eliminate should never worsen  during these exercises. If this pain does worsen, stop and make certain you are following the directions exactly. If the pain is still present after adjustments, discontinue the exercise until you can discuss the trouble with your caregiver. STRENGTHENING - Deep Abdominals, Pelvic Tilt   Lie on a firm bed or floor. Keeping your legs in front of you, bend your knees so they are both pointed toward the ceiling and your feet are flat on the floor.  Tense your lower abdominal muscles to press your low back into the floor. This motion will rotate your pelvis so that your tail bone is scooping upwards rather than pointing at your feet or into the floor. With a gentle tension and even breathing, hold this position for __________ seconds. Repeat __________ times. Complete this exercise __________ times per day.  STRENGTHENING - Abdominals, Crunches   Lie on a firm bed or floor. Keeping your legs in front of you, bend your knees so they are both pointed toward the ceiling and your feet are flat on the floor. Cross your arms over your chest.  Slightly tip your chin down without bending your neck.  Tense your abdominals and slowly lift your trunk high enough to just clear your shoulder blades. Lifting higher can put excessive stress on the lower back and does not further strengthen your abdominal muscles.  Control your return to the starting position. Repeat __________ times. Complete this exercise __________ times per day.  STRENGTHENING - Quadruped, Opposite UE/LE Lift   Assume a hands and knees position on a firm surface. Keep your hands under your shoulders and your knees under your hips. You may place padding under your knees for comfort.  Find your neutral spine and gently tense your abdominal muscles so that you can maintain this position. Your shoulders and hips should form a rectangle that is parallel with the floor and is not twisted.  Keeping your trunk steady, lift your right hand no higher  than your shoulder and then your left leg no higher than your hip. Make sure you are not holding your breath. Hold this position for __________ seconds.  Continuing to keep your abdominal muscles tense and your back steady, slowly return to your starting position. Repeat with the opposite arm and leg. Repeat __________ times. Complete this exercise __________ times per day.  STRENGTHENING - Abdominals and Quadriceps, Straight Leg Raise   Lie on a firm bed or floor with both legs extended in front of you.  Keeping one leg in contact with the floor, bend the other knee so that your foot can rest flat on the floor.  Find your neutral spine, and tense  your abdominal muscles to maintain your spinal position throughout the exercise.  Slowly lift your straight leg off the floor about 6 inches for a count of 15, making sure to not hold your breath.  Still keeping your neutral spine, slowly lower your leg all the way to the floor. Repeat this exercise with each leg __________ times. Complete this exercise __________ times per day. POSTURE AND BODY MECHANICS CONSIDERATIONS - Low Back Sprain Keeping correct posture when sitting, standing or completing your activities will reduce the stress put on different body tissues, allowing injured tissues a chance to heal and limiting painful experiences. The following are general guidelines for improved posture. Your physician or physical therapist will provide you with any instructions specific to your needs. While reading these guidelines, remember:  The exercises prescribed by your provider will help you have the flexibility and strength to maintain correct postures.  The correct posture provides the best environment for your joints to work. All of your joints have less wear and tear when properly supported by a spine with good posture. This means you will experience a healthier, less painful body.  Correct posture must be practiced with all of your activities,  especially prolonged sitting and standing. Correct posture is as important when doing repetitive low-stress activities (typing) as it is when doing a single heavy-load activity (lifting). RESTING POSITIONS Consider which positions are most painful for you when choosing a resting position. If you have pain with flexion-based activities (sitting, bending, stooping, squatting), choose a position that allows you to rest in a less flexed posture. You would want to avoid curling into a fetal position on your side. If your pain worsens with extension-based activities (prolonged standing, working overhead), avoid resting in an extended position such as sleeping on your stomach. Most people will find more comfort when they rest with their spine in a more neutral position, neither too rounded nor too arched. Lying on a non-sagging bed on your side with a pillow between your knees, or on your back with a pillow under your knees will often provide some relief. Keep in mind, being in any one position for a prolonged period of time, no matter how correct your posture, can still lead to stiffness. PROPER SITTING POSTURE In order to minimize stress and discomfort on your spine, you must sit with correct posture. Sitting with good posture should be effortless for a healthy body. Returning to good posture is a gradual process. Many people can work toward this most comfortably by using various supports until they have the flexibility and strength to maintain this posture on their own. When sitting with proper posture, your ears will fall over your shoulders and your shoulders will fall over your hips. You should use the back of the chair to support your upper back. Your lower back will be in a neutral position, just slightly arched. You may place a small pillow or folded towel at the base of your lower back for  support.  When working at a desk, create an environment that supports good, upright posture. Without extra support,  muscles tire, which leads to excessive strain on joints and other tissues. Keep these recommendations in mind: CHAIR:  A chair should be able to slide under your desk when your back makes contact with the back of the chair. This allows you to work closely.  The chair's height should allow your eyes to be level with the upper part of your monitor and your hands to be slightly lower  than your elbows. BODY POSITION  Your feet should make contact with the floor. If this is not possible, use a foot rest.  Keep your ears over your shoulders. This will reduce stress on your neck and low back. INCORRECT SITTING POSTURES  If you are feeling tired and unable to assume a healthy sitting posture, do not slouch or slump. This puts excessive strain on your back tissues, causing more damage and pain. Healthier options include:  Using more support, like a lumbar pillow.  Switching tasks to something that requires you to be upright or walking.  Talking a brief walk.  Lying down to rest in a neutral-spine position. PROLONGED STANDING WHILE SLIGHTLY LEANING FORWARD  When completing a task that requires you to lean forward while standing in one place for a long time, place either foot up on a stationary 2-4 inch high object to help maintain the best posture. When both feet are on the ground, the lower back tends to lose its slight inward curve. If this curve flattens (or becomes too large), then the back and your other joints will experience too much stress, tire more quickly, and can cause pain. CORRECT STANDING POSTURES Proper standing posture should be assumed with all daily activities, even if they only take a few moments, like when brushing your teeth. As in sitting, your ears should fall over your shoulders and your shoulders should fall over your hips. You should keep a slight tension in your abdominal muscles to brace your spine. Your tailbone should point down to the ground, not behind your body,  resulting in an over-extended swayback posture.  INCORRECT STANDING POSTURES  Common incorrect standing postures include a forward head, locked knees and/or an excessive swayback. WALKING Walk with an upright posture. Your ears, shoulders and hips should all line-up. PROLONGED ACTIVITY IN A FLEXED POSITION When completing a task that requires you to bend forward at your waist or lean over a low surface, try to find a way to stabilize 3 out of 4 of your limbs. You can place a hand or elbow on your thigh or rest a knee on the surface you are reaching across. This will provide you more stability, so that your muscles do not tire as quickly. By keeping your knees relaxed, or slightly bent, you will also reduce stress across your lower back. CORRECT LIFTING TECHNIQUES DO :  Assume a wide stance. This will provide you more stability and the opportunity to get as close as possible to the object which you are lifting.  Tense your abdominals to brace your spine. Bend at the knees and hips. Keeping your back locked in a neutral-spine position, lift using your leg muscles. Lift with your legs, keeping your back straight.  Test the weight of unknown objects before attempting to lift them.  Try to keep your elbows locked down at your sides in order get the best strength from your shoulders when carrying an object.  Always ask for help when lifting heavy or awkward objects. INCORRECT LIFTING TECHNIQUES DO NOT:   Lock your knees when lifting, even if it is a small object.  Bend and twist. Pivot at your feet or move your feet when needing to change directions.  Assume that you can safely pick up even a paperclip without proper posture.   This information is not intended to replace advice given to you by your health care provider. Make sure you discuss any questions you have with your health care provider.   Document  Released: 03/21/2005 Document Revised: 04/11/2014 Document Reviewed:  07/03/2008 [employer] Interactive Patient Education [employer]

## 2015-07-01 LAB — COMPLETE METABOLIC PANEL WITH GFR
ALT: 39 U/L (ref 9–46)
AST: 37 U/L (ref 10–40)
Albumin: 4.2 g/dL (ref 3.6–5.1)
Alkaline Phosphatase: 53 U/L (ref 40–115)
BILIRUBIN TOTAL: 0.9 mg/dL (ref 0.2–1.2)
BUN: 10 mg/dL (ref 7–25)
CO2: 25 mmol/L (ref 20–31)
Calcium: 9 mg/dL (ref 8.6–10.3)
Chloride: 106 mmol/L (ref 98–110)
Creat: 1.15 mg/dL (ref 0.60–1.35)
GFR, EST NON AFRICAN AMERICAN: 78 mL/min (ref >=60)
Glucose, Bld: 80 mg/dL (ref 65–99)
POTASSIUM: 3.4 mmol/L — AB (ref 3.5–5.3)
SODIUM: 140 mmol/L (ref 135–146)
TOTAL PROTEIN: 6.8 g/dL (ref 6.1–8.1)

## 2015-07-01 LAB — HEMOGLOBIN A1C
HEMOGLOBIN A1C: 6 % — AB (ref <5.7)
MEAN PLASMA GLUCOSE: 126 mg/dL

## 2015-07-03 MED ORDER — CLOBETASOL PROPIONATE 0.05 % EX SOLN: 1.0000 "application " | Freq: Two times a day (BID) | CUTANEOUS | Status: DC

## 2015-11-25 ENCOUNTER — Other Ambulatory Visit: Payer: Self-pay | Admitting: Family Medicine

## 2015-11-25 DIAGNOSIS — J4540 Moderate persistent asthma, uncomplicated: Secondary | ICD-10-CM

## 2015-12-31 ENCOUNTER — Other Ambulatory Visit: Payer: Self-pay | Admitting: Family Medicine

## 2015-12-31 DIAGNOSIS — J4540 Moderate persistent asthma, uncomplicated: Secondary | ICD-10-CM

## 2016-01-01 NOTE — Telephone Encounter (Signed)
Spoke with pt.  He has outstanding balance.  Tried to make appt this week, but unable to because he doesn't have the money to pay in full.  Advised to call [facility] and get payments set up, then call and make appt.  Advised to refill for 1 inhaler to give him time to get financial issues cleared.  Pt states he will call billing and will make appt for Sat or Mon of next week.

## 2016-01-01 NOTE — Telephone Encounter (Signed)
Pt needs appointment

## 2016-01-12 ENCOUNTER — Other Ambulatory Visit: Payer: Self-pay | Admitting: Physician Assistant

## 2016-01-13 MED ORDER — AMLODIPINE BESYLATE 5 MG PO TABS: ORAL_TABLET | ORAL | 0 refills | Status: DC

## 2016-01-13 NOTE — Addendum Note (Signed)
Addended by: Sheppard PlumberBRIGGS, Teresha Hanks A on: 01/13/2016 04:44 PM   Modules accepted: Orders

## 2016-01-18 ENCOUNTER — Ambulatory Visit (INDEPENDENT_AMBULATORY_CARE_PROVIDER_SITE_OTHER): Payer: BLUE CROSS/BLUE SHIELD | Admitting: Physician Assistant

## 2016-01-18 VITALS — BP 118/80 | HR 76 | Temp 97.9°F | Resp 18 | Ht 68.5 in | Wt 190.8 lb

## 2016-01-18 DIAGNOSIS — R21 Rash and other nonspecific skin eruption: Secondary | ICD-10-CM

## 2016-01-18 DIAGNOSIS — J45909 Unspecified asthma, uncomplicated: Secondary | ICD-10-CM | POA: Diagnosis not present

## 2016-01-18 MED ORDER — ALBUTEROL SULFATE HFA 108 (90 BASE) MCG/ACT IN AERS: INHALATION_SPRAY | RESPIRATORY_TRACT | 0 refills | Status: DC

## 2016-01-18 MED ORDER — CLOBETASOL PROPIONATE 0.05 % EX SOLN: 1.0000 "application " | Freq: Two times a day (BID) | CUTANEOUS | 0 refills | Status: DC

## 2016-01-18 MED ORDER — ALBUTEROL SULFATE (2.5 MG/3ML) 0.083% IN NEBU: INHALATION_SOLUTION | RESPIRATORY_TRACT | 3 refills | Status: DC

## 2016-01-18 NOTE — Patient Instructions (Signed)
     IF you received an x-ray today, you will receive an invoice from [facility]. Please contact [facility] at [phone] with questions or concerns regarding your invoice.   IF you received labwork today, you will receive an invoice from [facility]. Please contact [facility] at [phone] with questions or concerns regarding your invoice.   Our billing staff will not be able to assist you with questions regarding bills from these companies.  You will be contacted with the lab results as soon as they are available. The fastest way to get your results is to activate your My Chart account. Instructions are located on the last page of this paperwork. If you have not heard from us regarding the results in 2 weeks, please contact this office.      

## 2016-01-18 NOTE — Progress Notes (Signed)
Patient ID: Casey [name], male    DOB: July 06, 1970, 45 y.o.   MRN: [id number]  PCP: No PCP Per Patient  Subjective:   Chief Complaint  Patient presents with  . Medication Refill    albuterol and amlodipine  . Rash    Located on top of head and around genital area.     HPI Presents for evaluation of rash since 2012.  He reports that the rash started on the scalp, with just a couple of little spots. Then he developed lesions between the buttocks and on the glans penis, similar to what he recalls seeing when his sons were in diapers. All three of these areas have become larger over the years. There is intermittent itching, sometimes very itchy. He has been told that the rash is due to a fungal infection and was prescribed a clotrimazole-betamethasone cream, which worked better than OTC anti-fungal cream and triple antibiotic ointment, which helps some.   Review of Systems As above.    Patient Active Problem List   Diagnosis Date Noted  . Other seasonal allergic rhinitis 01/02/2014  . External hemorrhoids 01/02/2014  . Insomnia 01/02/2014  . Essential hypertension 11/14/2013  . Palpitations 11/14/2013  . Generalized anxiety disorder 11/14/2013  . Asthma 10/24/2012     Prior to Admission medications   Medication Sig Start Date End Date Taking? Authorizing Provider  amLODipine (NORVASC) 5 MG tablet TAKE 1 TABLET(5 MG) BY MOUTH DAILY 01/13/16  Yes Wallis BambergMario Mani, PA-C  Aspirin-Salicylamide-Caffeine (BC HEADACHE POWDER PO) Take 1 packet by mouth daily as needed (for pain).   Yes Historical Provider, MD  clobetasol (TEMOVATE) 0.05 % external solution Apply 1 application topically 2 (two) times daily. For maximum 10 days. 07/03/15  Yes Emi Belfasteborah B Gessner, FNP  albuterol (PROVENTIL) (2.5 MG/3ML) 0.083% nebulizer solution USE 1 VIAL VIA NEBULIZER Q 6 H PRF WHEEZING OR SOB 12/15/15   Historical Provider, MD  fluticasone (FLOVENT HFA) 110 MCG/ACT inhaler Inhale 2 puffs into the lungs 2  (two) times daily. Patient not taking: Reported on 01/18/2016 06/30/15   Emi Belfasteborah B Gessner, FNP  Spacer/Aero-Holding Chambers (AEROCHAMBER PLUS) inhaler Use as instructed Patient not taking: Reported on 01/18/2016 11/14/13   Andrena MewsMichael D Rigby, DO  VENTOLIN HFA 108 (90 Base) MCG/ACT inhaler INHALE 1 TO 2 PUFFS INTO THE LUNGS EVERY 6 HOURS AS NEEDED Patient not taking: Reported on 01/18/2016 01/01/16   Porfirio Oarhelle Travarus Trudo, PA-C     No Known Allergies     Objective:  Physical Exam  Constitutional: He is oriented to person, place, and time. He appears well-developed and well-nourished. He is active and cooperative. No distress.  BP 118/80   Pulse 76   Temp 97.9 F (36.6 C) (Oral)   Resp 18   Ht 5' 8.5" (1.74 m)   Wt 190 lb 12.8 oz (86.5 kg)   SpO2 98%   BMI 28.59 kg/m    HENT:  Head:    Eyes: Conjunctivae are normal.  Pulmonary/Chest: Effort normal.  Genitourinary:     Neurological: He is alert and oriented to person, place, and time.  Skin: Skin is warm and dry.     Psychiatric: He has a normal mood and affect. His speech is normal and behavior is normal.           Assessment & Plan:   1. 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 Trial of steroid cream, BID x 10-14 days. Then re-evaluate. May need dermatology evaluation. - clobetasol (TEMOVATE) 0.05 % external solution;  Apply 1 application topically 2 (two) times daily. For maximum 10 days.  Dispense: 50 mL; Refill: 0  2. Uncomplicated asthma, unspecified asthma severity, unspecified whether persistent Stable. - albuterol (PROVENTIL) (2.5 MG/3ML) 0.083% nebulizer solution; USE 1 VIAL VIA NEBULIZER Q 6 H PRF WHEEZING OR SOB  Dispense: 75 mL; Refill: 3 - albuterol (VENTOLIN HFA) 108 (90 Base) MCG/ACT inhaler; INHALE 1 TO 2 PUFFS INTO THE LUNGS EVERY 6 HOURS AS NEEDED  Dispense: 18 g; Refill: 0  3. HTN.  Controlled. He doesn't need a refill now, and will let us know when he does. OK to refill x 6 months.   Fernande Bras, PA-C Physician Assistant-Certified [facility]

## 2016-02-09 ENCOUNTER — Other Ambulatory Visit: Payer: Self-pay | Admitting: Physician Assistant

## 2016-02-10 ENCOUNTER — Other Ambulatory Visit: Payer: Self-pay | Admitting: Physician Assistant

## 2016-02-10 DIAGNOSIS — J4540 Moderate persistent asthma, uncomplicated: Secondary | ICD-10-CM

## 2016-02-10 DIAGNOSIS — J45909 Unspecified asthma, uncomplicated: Secondary | ICD-10-CM

## 2016-02-10 NOTE — Telephone Encounter (Signed)
01/18/16 last ov 01/08/16 last inhaler refill

## 2016-02-11 NOTE — Telephone Encounter (Signed)
Meds ordered this encounter  Medications  . VENTOLIN HFA 108 (90 Base) MCG/ACT inhaler    Sig: INHALE 1 TO 2 PUFFS INTO THE LUNGS EVERY 6 HOURS AS NEEDED    Dispense:  18 g    Refill:  0

## 2016-02-29 ENCOUNTER — Other Ambulatory Visit: Payer: Self-pay | Admitting: Family Medicine

## 2016-03-01 ENCOUNTER — Other Ambulatory Visit: Payer: Self-pay | Admitting: Family Medicine

## 2016-03-02 ENCOUNTER — Encounter: Payer: Self-pay | Admitting: Physician Assistant

## 2016-03-02 ENCOUNTER — Ambulatory Visit (INDEPENDENT_AMBULATORY_CARE_PROVIDER_SITE_OTHER): Payer: BLUE CROSS/BLUE SHIELD | Admitting: Physician Assistant

## 2016-03-02 ENCOUNTER — Ambulatory Visit (INDEPENDENT_AMBULATORY_CARE_PROVIDER_SITE_OTHER): Payer: BLUE CROSS/BLUE SHIELD

## 2016-03-02 VITALS — BP 136/87 | HR 125 | Temp 98.5°F | Resp 20

## 2016-03-02 DIAGNOSIS — R05 Cough: Secondary | ICD-10-CM

## 2016-03-02 DIAGNOSIS — R0603 Acute respiratory distress: Secondary | ICD-10-CM

## 2016-03-02 DIAGNOSIS — J45909 Unspecified asthma, uncomplicated: Secondary | ICD-10-CM

## 2016-03-02 DIAGNOSIS — R059 Cough, unspecified: Secondary | ICD-10-CM

## 2016-03-02 LAB — POCT CBC
Granulocyte percent: 84.3 %G — AB (ref 37–80)
HCT, POC: 48.7 % (ref 43.5–53.7)
HEMOGLOBIN: 17.1 g/dL (ref 14.1–18.1)
LYMPH, POC: 1.8 (ref 0.6–3.4)
MCH, POC: 29.3 pg (ref 27–31.2)
MCHC: 35.2 g/dL (ref 31.8–35.4)
MCV: 83.2 fL (ref 80–97)
MID (cbc): 0.5 (ref 0–0.9)
MPV: 7.8 fL (ref 0–99.8)
PLATELET COUNT, POC: 213 10*3/uL (ref 142–424)
POC Granulocyte: 12.4 — AB (ref 2–6.9)
POC LYMPH PERCENT: 12.3 %L (ref 10–50)
POC MID %: 3.4 % (ref 0–12)
RBC: 5.84 M/uL (ref 4.69–6.13)
RDW, POC: 14.1 %
WBC: 14.7 10*3/uL — AB (ref 4.6–10.2)

## 2016-03-02 MED ORDER — BENZONATATE 100 MG PO CAPS: 100.0000 mg | ORAL_CAPSULE | Freq: Three times a day (TID) | ORAL | 0 refills | Status: DC | PRN

## 2016-03-02 MED ORDER — GUAIFENESIN ER 1200 MG PO TB12: 1.0000 | ORAL_TABLET | Freq: Two times a day (BID) | ORAL | 1 refills | Status: DC | PRN

## 2016-03-02 MED ORDER — METHYLPREDNISOLONE SODIUM SUCC 125 MG IJ SOLR
125.0000 mg | Freq: Once | INTRAMUSCULAR | Status: AC
  Administered 2016-03-02: 125 mg via INTRAVENOUS

## 2016-03-02 MED ORDER — AEROCHAMBER PLUS MISC: 1 refills | Status: DC

## 2016-03-02 MED ORDER — ALBUTEROL SULFATE HFA 108 (90 BASE) MCG/ACT IN AERS: INHALATION_SPRAY | RESPIRATORY_TRACT | 0 refills | Status: DC

## 2016-03-02 MED ORDER — ALBUTEROL SULFATE (2.5 MG/3ML) 0.083% IN NEBU
2.5000 mg | INHALATION_SOLUTION | Freq: Once | RESPIRATORY_TRACT | Status: AC
  Administered 2016-03-02: 2.5 mg via RESPIRATORY_TRACT

## 2016-03-02 MED ORDER — IPRATROPIUM BROMIDE 0.02 % IN SOLN
0.5000 mg | Freq: Once | RESPIRATORY_TRACT | Status: AC
  Administered 2016-03-02: 0.5 mg via RESPIRATORY_TRACT

## 2016-03-02 MED ORDER — AZITHROMYCIN 250 MG PO TABS: ORAL_TABLET | ORAL | 0 refills | Status: DC

## 2016-03-02 MED ORDER — PREDNISONE 20 MG PO TABS: ORAL_TABLET | ORAL | 0 refills | Status: DC

## 2016-03-02 NOTE — Progress Notes (Signed)
Iv started in right anticubital 20g with 1000cc ns Hudsen Fei rn

## 2016-03-02 NOTE — Patient Instructions (Signed)
     IF you received an x-ray today, you will receive an invoice from [facility]. Please contact [facility] at [phone] with questions or concerns regarding your invoice.   IF you received labwork today, you will receive an invoice from [facility]. Please contact [facility] at [phone] with questions or concerns regarding your invoice.   Our billing staff will not be able to assist you with questions regarding bills from these companies.  You will be contacted with the lab results as soon as they are available. The fastest way to get your results is to activate your My Chart account. Instructions are located on the last page of this paperwork. If you have not heard from us regarding the results in 2 weeks, please contact this office.      

## 2016-03-02 NOTE — Progress Notes (Signed)
Patient ID: Casey [name], male    DOB: Jun 24, 1970, 45 y.o.   MRN: [id number]  PCP: No PCP Per Patient  Chief Complaint  Patient presents with  . Asthma    attack started last night    Subjective:   Presents for evaluation of asthma attack. He is accompanied by his wife.  He was brought into the exam room and I was asked to see him urgently. O2 by [place] and neb treatment started immediately.  He started to have difficulty breathing yesterday afternoon. Symptoms became progressively worse over night. Home albuterol nebs were somewhat helpful, but only while he was using them. As soon as the treatment was complete, the symptoms worsened again.  Fever and chills over night. Cough produces a darker than usual sputum.  Wife reports that he gets this every year about this time. He doesn't use maintenance inhaler at all, hasn't for months, but uses the albuterol rescue inhaler at least twice every day and sometimes "not often" during the night.    Review of Systems  Constitutional: Positive for chills, fatigue and fever.  HENT: Negative for congestion, postnasal drip, rhinorrhea, sinus pain, sinus pressure, sneezing, sore throat, trouble swallowing and voice change.   Eyes: Negative for visual disturbance.  Respiratory: Positive for cough, chest tightness, shortness of breath and wheezing. Negative for apnea, choking and stridor.   Cardiovascular: Negative for chest pain, palpitations and leg swelling.  Gastrointestinal: Negative for abdominal pain, constipation, diarrhea, nausea and vomiting.  Musculoskeletal: Positive for back pain. Negative for arthralgias and gait problem.  Neurological: Negative for dizziness, facial asymmetry, weakness, light-headedness, numbness and headaches.  Hematological: Negative for adenopathy. Does not bruise/bleed easily.       Patient Active Problem List   Diagnosis Date Noted  . Other seasonal allergic rhinitis 01/02/2014  . External hemorrhoids  01/02/2014  . Insomnia 01/02/2014  . Essential hypertension 11/14/2013  . Palpitations 11/14/2013  . Generalized anxiety disorder 11/14/2013  . Asthma 10/24/2012     Prior to Admission medications   Medication Sig Start Date End Date Taking? Authorizing Provider  albuterol (PROVENTIL) (2.5 MG/3ML) 0.083% nebulizer solution USE 1 VIAL VIA NEBULIZER Q 6 H PRF WHEEZING OR SOB 01/18/16   Harshita Bernales, PA-C  amLODipine (NORVASC) 5 MG tablet TAKE 1 TABLET BY MOUTH DAILY 02/10/16   Morrell Riddle, PA-C  Aspirin-Salicylamide-Caffeine (BC HEADACHE POWDER PO) Take 1 packet by mouth daily as needed (for pain).    Historical Provider, MD  clobetasol (TEMOVATE) 0.05 % external solution Apply 1 application topically 2 (two) times daily. For maximum 10 days. 01/18/16   Ondre Salvetti, PA-C  fluticasone (FLOVENT HFA) 110 MCG/ACT inhaler Inhale 2 puffs into the lungs 2 (two) times daily. Patient not taking: Reported on 01/18/2016 06/30/15   Emi Belfast, FNP  Spacer/Aero-Holding Chambers (AEROCHAMBER PLUS) inhaler Use as instructed Patient not taking: Reported on 01/18/2016 11/14/13   Andrena Mews, DO  VENTOLIN HFA 108 (90 Base) MCG/ACT inhaler INHALE 1 TO 2 PUFFS INTO THE LUNGS EVERY 6 HOURS AS NEEDED 02/11/16   Blase Beckner, PA-C     No Known Allergies     Objective:  Physical Exam  Constitutional: He is oriented to person, place, and time. He appears well-developed and well-nourished. He is active and cooperative. He appears distressed.  BP (!) 152/86   Pulse (!) 116   Temp 98.5 F (36.9 C) (Oral)   Resp 20   SpO2 96%   HENT:  Head: Normocephalic and  atraumatic.  Right Ear: Hearing, tympanic membrane, external ear and ear canal normal.  Left Ear: Hearing, tympanic membrane, external ear and ear canal normal.  Nose: Nose normal.  Mouth/Throat: Uvula is midline, oropharynx is clear and moist and mucous membranes are normal. No uvula swelling.  Eyes: Conjunctivae are normal. No scleral  icterus.  Neck: Normal range of motion, full passive range of motion without pain and phonation normal. Neck supple. No thyromegaly present.  Cardiovascular: Regular rhythm and normal heart sounds.  Tachycardia present.   Pulses:      Radial pulses are 2+ on the right side, and 2+ on the left side.  Pulmonary/Chest: Effort normal.  Initial exam notable for decreased breath sounds and diffuse high-pitched musical wheezes.  Following initial neb of albuterol + Atrovent, only minimal improvement.  Following second neb of albuterol and IV Solumedrol 125 IV he had moderate improvement.  Following third neb of albuterol, he reported significant improvement, but remained tachypneic and in mild respiratory distress. Following a 4th albuterol neb, wheezing was considerably improved, though persistent in the LEFT lower lung fields. Respiratory rate normalized and effort was normal with observation over the next 30 minutes.  Lymphadenopathy:       Head (right side): No tonsillar, no preauricular, no posterior auricular and no occipital adenopathy present.       Head (left side): No tonsillar, no preauricular, no posterior auricular and no occipital adenopathy present.    He has no cervical adenopathy.       Right: No supraclavicular adenopathy present.       Left: No supraclavicular adenopathy present.  Neurological: He is alert and oriented to person, place, and time. No sensory deficit.  Skin: Skin is warm, dry and intact. No rash noted. No cyanosis or erythema. Nails show no clubbing.  Psychiatric: He has a normal mood and affect. His speech is normal and behavior is normal.   Dg Chest 2 View  Result Date: 03/02/2016 CLINICAL DATA:  Pt c/o difficulty breathing, productive cough, fever and chills started yesterday and getting worse overnight. Hx of asthma. EXAM: CHEST - 2 VIEW COMPARISON:  02/24/2015 FINDINGS: Mild hyperinflation with somewhat coarse perihilar bronchovascular markings. No focal  infiltrate or overt edema. Heart size and mediastinal contours are within normal limits. No effusion. Visualized bones unremarkable. IMPRESSION: No acute cardiopulmonary disease. Electronically Signed   By: Corlis Leak  Hassell M.D.   On: 03/02/2016 09:53           Assessment & Plan:   1. Cough 2. Acute respiratory distress 3. Uncomplicated asthma, unspecified asthma severity, unspecified whether persistent Non-compliant with maintenance treatment. He does not want to go to the ED. Given the substantial improvement following the 4th neb, agreed to discharge to home, with strict return/ED precautions. He will RTC tomorrow for re-evaluation with me. Antibiotic coverage due to elevated WBC with left shift, despite lack of infiltrate on CXR.  IN OFFICE: - POCT CBC - DG Chest 2 View; Future - albuterol (PROVENTIL) (2.5 MG/3ML) 0.083% nebulizer solution 2.5 mg; Take 3 mLs (2.5 mg total) by nebulization once. - ipratropium (ATROVENT) nebulizer solution 0.5 mg; Take 2.5 mLs (0.5 mg total) by nebulization once.- methylPREDNISolone sodium succinate (SOLU-MEDROL) 125 mg/2 mL injection 125 mg; Inject 2 mLs (125 mg total) into the vein once. - Insert peripheral IV - albuterol (PROVENTIL) (2.5 MG/3ML) 0.083% nebulizer solution 2.5 mg; Take 3 mLs (2.5 mg total) by nebulization once. - albuterol (PROVENTIL) (2.5 MG/3ML) 0.083% nebulizer solution 2.5 mg; Take 3  mLs (2.5 mg total) by nebulization once. - albuterol (PROVENTIL) (2.5 MG/3ML) 0.083% nebulizer solution 2.5 mg; Take 3 mLs (2.5 mg total) by nebulization once.   FOR HOME: -refills of steroid inhaler are on file at his pharmacy. - Guaifenesin (MUCINEX MAXIMUM STRENGTH) 1200 MG TB12; Take 1 tablet (1,200 mg total) by mouth every 12 (twelve) hours as needed.  Dispense: 14 tablet; Refill: 1 - benzonatate (TESSALON) 100 MG capsule; Take 1-2 capsules (100-200 mg total) by mouth 3 (three) times daily as needed for cough.  Dispense: 40 capsule; Refill: 0 -  azithromycin (ZITHROMAX) 250 MG tablet; Take 2 tabs PO x 1 dose, then 1 tab PO QD x 4 days  Dispense: 6 tablet; Refill: 0 - predniSONE (DELTASONE) 20 MG tablet; Take 3 PO QAM x3days, 2 PO QAM x3days, 1 PO QAM x3days  Dispense: 18 tablet; Refill: 0 - albuterol (VENTOLIN HFA) 108 (90 Base) MCG/ACT inhaler; INHALE 1 TO 2 PUFFS INTO THE LUNGS EVERY 6 HOURS AS NEEDED  Dispense: 18 g; Refill: 0 - Spacer/Aero-Holding Chambers (AEROCHAMBER PLUS) inhaler; Use as instructed  Dispense: 1 each; Refill: 1   Fernande Brashelle S. Sheenah Dimitroff, PA-C Physician Assistant-Certified [facility]

## 2016-03-03 ENCOUNTER — Ambulatory Visit: Payer: BLUE CROSS/BLUE SHIELD

## 2016-03-15 ENCOUNTER — Other Ambulatory Visit: Payer: Self-pay | Admitting: *Deleted

## 2016-03-15 ENCOUNTER — Other Ambulatory Visit: Payer: Self-pay | Admitting: Physician Assistant

## 2016-03-15 DIAGNOSIS — J45909 Unspecified asthma, uncomplicated: Secondary | ICD-10-CM

## 2016-03-15 DIAGNOSIS — R21 Rash and other nonspecific skin eruption: Secondary | ICD-10-CM | POA: Insufficient documentation

## 2016-03-15 DIAGNOSIS — I10 Essential (primary) hypertension: Secondary | ICD-10-CM

## 2016-03-15 MED ORDER — AMLODIPINE BESYLATE 5 MG PO TABS: 5.0000 mg | ORAL_TABLET | Freq: Every day | ORAL | 1 refills | Status: DC

## 2016-03-15 NOTE — Telephone Encounter (Signed)
Chelle,    Patient stated that he thought you wanted to give him another inhaler.  He is still using nebulizer and the ventolin inhaler 2x a day.       He stated he has about 37 pumps left on it.

## 2016-03-16 NOTE — Telephone Encounter (Signed)
I didn't prescribe a steroid inhaler, because he has refills on file at the pharmacy for Flovent. He was advised to use 2 puffs twice a day. OK to refill if the pharmacy says they don't have them.  Once he adds that, we expect to see a significant improvement in his symptoms and reduced need for the albuterol.  Needs re-evaluation 4 weeks after starting the Flovent, sooner if he worsens.

## 2016-03-16 NOTE — Telephone Encounter (Signed)
Spoke with patient he is going to fill the Flovent. Will call if there are any issues..Marland Kitchen

## 2016-03-31 ENCOUNTER — Other Ambulatory Visit: Payer: Self-pay | Admitting: Physician Assistant

## 2016-03-31 DIAGNOSIS — J45909 Unspecified asthma, uncomplicated: Secondary | ICD-10-CM

## 2016-04-18 ENCOUNTER — Ambulatory Visit (INDEPENDENT_AMBULATORY_CARE_PROVIDER_SITE_OTHER): Payer: BLUE CROSS/BLUE SHIELD

## 2016-04-18 ENCOUNTER — Ambulatory Visit (INDEPENDENT_AMBULATORY_CARE_PROVIDER_SITE_OTHER): Payer: BLUE CROSS/BLUE SHIELD | Admitting: Physician Assistant

## 2016-04-18 VITALS — BP 132/80 | HR 75 | Temp 98.1°F | Resp 18 | Ht 68.5 in | Wt 189.0 lb

## 2016-04-18 DIAGNOSIS — Z9114 Patient's other noncompliance with medication regimen: Secondary | ICD-10-CM

## 2016-04-18 DIAGNOSIS — J45909 Unspecified asthma, uncomplicated: Secondary | ICD-10-CM

## 2016-04-18 DIAGNOSIS — G4730 Sleep apnea, unspecified: Secondary | ICD-10-CM | POA: Diagnosis not present

## 2016-04-18 DIAGNOSIS — R062 Wheezing: Secondary | ICD-10-CM

## 2016-04-18 DIAGNOSIS — R0981 Nasal congestion: Secondary | ICD-10-CM | POA: Diagnosis not present

## 2016-04-18 DIAGNOSIS — R059 Cough, unspecified: Secondary | ICD-10-CM

## 2016-04-18 DIAGNOSIS — R05 Cough: Secondary | ICD-10-CM

## 2016-04-18 DIAGNOSIS — Z91148 Patient's other noncompliance with medication regimen for other reason: Secondary | ICD-10-CM

## 2016-04-18 LAB — POCT CBC
Granulocyte percent: 57.8 %G (ref 37–80)
HCT, POC: 48.4 % (ref 43.5–53.7)
Hemoglobin: 16.9 g/dL (ref 14.1–18.1)
Lymph, poc: 2.3 (ref 0.6–3.4)
MCH, POC: 28.6 pg (ref 27–31.2)
MCHC: 34.9 g/dL (ref 31.8–35.4)
MCV: 81.9 fL (ref 80–97)
MID (cbc): 0.4 (ref 0–0.9)
MPV: 6.8 fL (ref 0–99.8)
POC Granulocyte: 3.7 (ref 2–6.9)
POC LYMPH PERCENT: 36.7 %L (ref 10–50)
POC MID %: 5.5 % (ref 0–12)
Platelet Count, POC: 218 10*3/uL (ref 142–424)
RBC: 5.91 M/uL (ref 4.69–6.13)
RDW, POC: 14 %
WBC: 6.4 10*3/uL (ref 4.6–10.2)

## 2016-04-18 MED ORDER — MONTELUKAST SODIUM 10 MG PO TABS: 10.0000 mg | ORAL_TABLET | Freq: Every day | ORAL | 3 refills | Status: DC

## 2016-04-18 MED ORDER — MUCINEX DM MAXIMUM STRENGTH 60-1200 MG PO TB12: 1.0000 | ORAL_TABLET | Freq: Two times a day (BID) | ORAL | 1 refills | Status: DC

## 2016-04-18 MED ORDER — PREDNISONE 20 MG PO TABS: ORAL_TABLET | ORAL | 0 refills | Status: DC

## 2016-04-18 MED ORDER — FLUTICASONE PROPIONATE 50 MCG/ACT NA SUSP: 2.0000 | Freq: Every day | NASAL | 6 refills | Status: DC

## 2016-04-18 NOTE — Patient Instructions (Addendum)
Please do not take your Albuterol every morning!! This is your rescue inhaler. If you use this every day, it will not be as effective when you really need it!   Please use your Flovent every day. Ventolin as needed.    Please drink at least 2-3 liters of water a day.  Flonase: 2 puffs each nostril at night before bed. This will reduce your morning mucus. Hot showers will also help.    Sleep studies will call you to set an appointment.   Thank you for coming in today. I hope you feel we met your needs.  Feel free to call [facility] if you have any questions or further requests.  Please consider signing up for MyChart if you do not already have it, as this is a great way to communicate with me.  Best,  Whitney McVey, PA-C  IF you received an x-ray today, you will receive an invoice from [facility]. Please contact [facility] at [phone] with questions or concerns regarding your invoice.   IF you received labwork today, you will receive an invoice from [facility]. Please contact [facility] at [phone] with questions or concerns regarding your invoice.   Our billing staff will not be able to assist you with questions regarding bills from these companies.  You will be contacted with the lab results as soon as they are available. The fastest way to get your results is to activate your My Chart account. Instructions are located on the last page of this paperwork. If you have not heard from [place] regarding the results in 2 weeks, please contact this office.

## 2016-04-18 NOTE — Progress Notes (Signed)
Casey [name]  MRN: [id number] DOB: Jul 08, 1970  PCP: No PCP Per Patient  Subjective:  Pt is a 46 year old male PMH HTN, asthma, anxiety, who presents to clinic for nasal congestion and cough x several weeks.  He was here 11/29 for asthma attack, received several rounds of nebulizer treatments, IM Solumedrol. He felt 100% better after treatment. About 2 weeks later he felt sick again with many of the same symptoms: nasal congestion, productive cough, fever, chills.  Today c/o cough and congestion. Productive cough, however he cannot cough up what he feels like he needs to. He took a nebulizing treatment today before he came here.   Flovent was prescribed in November. He takes Albuterol in the morning and "chasing it with Flovent." He is using his Flovent twice a day. Using his Albuterol 4-5 times/day the past few weeks.   History of asthma - Really hot and seasonal allergies make him have bad days. This winter has been really bad for him.   Of note: His wife states that he snores. She says it scares her bc he stops breathing then gasps for air. He wakes up with headaches in the morning, happens a few times a week.    Review of Systems  Constitutional: Positive for chills and fever. Negative for diaphoresis and fatigue.  HENT: Positive for congestion and postnasal drip. Negative for rhinorrhea, sinus pain, sinus pressure and sore throat.   Respiratory: Positive for apnea, cough, chest tightness and wheezing. Negative for shortness of breath.   Cardiovascular: Negative for chest pain and palpitations.  Gastrointestinal: Negative for abdominal pain, diarrhea, nausea and vomiting.  Allergic/Immunologic: Positive for environmental allergies. Negative for food allergies.  Neurological: Positive for headaches. Negative for dizziness, syncope and light-headedness.  Psychiatric/Behavioral: Positive for sleep disturbance.    Patient Active Problem List   Diagnosis Date Noted  . Rash, skin  03/15/2016  . Other seasonal allergic rhinitis 01/02/2014  . External hemorrhoids 01/02/2014  . Insomnia 01/02/2014  . Essential hypertension 11/14/2013  . Palpitations 11/14/2013  . Generalized anxiety disorder 11/14/2013  . Asthma 10/24/2012    Current Outpatient Prescriptions on File Prior to Visit  Medication Sig Dispense Refill  . albuterol (PROVENTIL) (2.5 MG/3ML) 0.083% nebulizer solution USE 1 VIAL VIA NEBULIZER Q 6 H PRF WHEEZING OR SOB 75 mL 3  . amLODipine (NORVASC) 5 MG tablet Take 1 tablet (5 mg total) by mouth daily. 90 tablet 1  . clobetasol (TEMOVATE) 0.05 % external solution Apply 1 application topically 2 (two) times daily. For maximum 10 days. 50 mL 0  . fluticasone (FLOVENT HFA) 110 MCG/ACT inhaler Inhale 2 puffs into the lungs 2 (two) times daily. 1 Inhaler 12  . VENTOLIN HFA 108 (90 Base) MCG/ACT inhaler INHALE 1 TO 2 PUFFS INTO THE LUNGS EVERY 6 HOURS AS NEEDED 18 g 0  . Spacer/Aero-Holding Chambers (AEROCHAMBER PLUS) inhaler Use as instructed (Patient not taking: Reported on 04/18/2016) 1 each 1   No current facility-administered medications on file prior to visit.     No Known Allergies   Objective:  BP 132/80 (BP Location: Right Arm, Patient Position: Sitting, Cuff Size: Small)   Pulse 75   Temp 98.1 F (36.7 C) (Oral)   Resp 18   Ht 5' 8.5" (1.74 m)   Wt 189 lb (85.7 kg)   SpO2 96%   BMI 28.32 kg/m   Physical Exam  Constitutional: He is oriented to person, place, and time and well-developed, well-nourished, and in no  distress. No distress.  Cardiovascular: Normal rate, regular rhythm and normal heart sounds.   Pulmonary/Chest: Effort normal. He has no decreased breath sounds. He has wheezes in the right upper field, the right middle field, the left upper field and the left middle field. He has no rhonchi. He has no rales.  Neurological: He is alert and oriented to person, place, and time. GCS score is 15.  Skin: Skin is warm and dry.  Psychiatric:  Mood, memory, affect and judgment normal.  Vitals reviewed.   Results for orders placed or performed in visit on 04/18/16  POCT CBC  Result Value Ref Range   WBC 6.4 4.6 - 10.2 K/uL   Lymph, poc 2.3 0.6 - 3.4   POC LYMPH PERCENT 36.7 10 - 50 %L   MID (cbc) 0.4 0 - 0.9   POC MID % 5.5 0 - 12 %M   POC Granulocyte 3.7 2 - 6.9   Granulocyte percent 57.8 37 - 80 %G   RBC 5.91 4.69 - 6.13 M/uL   Hemoglobin 16.9 14.1 - 18.1 g/dL   HCT, POC 16.1 09.6 - 53.7 %   MCV 81.9 80 - 97 fL   MCH, POC 28.6 27 - 31.2 pg   MCHC 34.9 31.8 - 35.4 g/dL   RDW, POC 04.5 %   Platelet Count, POC 218 142 - 424 K/uL   MPV 6.8 0 - 99.8 fL    Assessment and Plan :  1. Uncomplicated asthma, unspecified asthma severity, unspecified whether persistent 2. Noncompliance with medication regimen 3. Cough 4. Nasal congestion 5. Wheezing - DG Abd 2 Views; Future - POCT CBC - Spirometry: Peak - montelukast (SINGULAIR) 10 MG tablet; Take 1 tablet (10 mg total) by mouth at bedtime.  Dispense: 30 tablet; Refill: 3 - fluticasone (FLONASE) 50 MCG/ACT nasal spray; Place 2 sprays into both nostrils daily.  Dispense: 16 g; Refill: 6 - Dextromethorphan-Guaifenesin (MUCINEX DM MAXIMUM STRENGTH) 60-1200 MG TB12; Take 1 tablet by mouth every 12 (twelve) hours.  Dispense: 14 each; Refill: 1 - predniSONE (DELTASONE) 20 MG tablet; Take 3 PO QAM x3days, 2 PO QAM x3days, 1 PO QAM x3days  Dispense: 18 tablet; Refill: 0 - Pt refused nebulizer today.  - Discussed at length with patient need for mediation compliance. Flovent every morning and Ventolin as needed. He has been using Ventolin every morning and "chasing it with Flovent". Told pt his Ventolin will not be as effective if he continues to do this. Will start him on Singulair as he suffers from seasonal allergies. RTC if symptoms return/worsen. Stay well hydrated.   6. Sleep apnea, unspecified type - Ambulatory referral to Sleep Studies   Marco Collie, PA-C  [facility] 04/18/2016 1:48 PM

## 2016-04-22 ENCOUNTER — Other Ambulatory Visit: Payer: Self-pay | Admitting: Physician Assistant

## 2016-04-22 DIAGNOSIS — J45909 Unspecified asthma, uncomplicated: Secondary | ICD-10-CM

## 2016-05-16 ENCOUNTER — Other Ambulatory Visit: Payer: Self-pay | Admitting: Physician Assistant

## 2016-05-16 DIAGNOSIS — J45909 Unspecified asthma, uncomplicated: Secondary | ICD-10-CM

## 2016-05-17 NOTE — Telephone Encounter (Signed)
Refill sent in for Ventolin inhaler. Recommended not using this together with Proventil as they both contain albuterol. Patient has longstanding uncontrolled asthma. I recommended he set up a f/u appt. He verbalized understanding.

## 2016-09-24 ENCOUNTER — Other Ambulatory Visit: Payer: Self-pay | Admitting: Physician Assistant

## 2016-09-24 DIAGNOSIS — J45909 Unspecified asthma, uncomplicated: Secondary | ICD-10-CM

## 2016-09-29 ENCOUNTER — Other Ambulatory Visit: Payer: Self-pay | Admitting: Urgent Care

## 2016-09-29 DIAGNOSIS — J45909 Unspecified asthma, uncomplicated: Secondary | ICD-10-CM

## 2016-09-29 NOTE — Telephone Encounter (Signed)
I have never seen this patient. Last OV was with PA-McVey, will route this to her for her review.

## 2016-10-09 ENCOUNTER — Other Ambulatory Visit: Payer: Self-pay | Admitting: Physician Assistant

## 2016-10-09 NOTE — Telephone Encounter (Signed)
Please call patient. Rx authorized. Needs to schedule follow-up and establish with a PCP.  Meds ordered this encounter  Medications  . amLODipine (NORVASC) 5 MG tablet    Sig: TAKE 1 TABLET BY MOUTH DAILY    Dispense:  90 tablet    Refill:  0

## 2016-10-10 ENCOUNTER — Telehealth: Payer: Self-pay | Admitting: Family Medicine

## 2016-10-10 NOTE — Telephone Encounter (Signed)
LMOM FOP PT TO CALL AND SCHEDULE  AN OV WITH A PCP FOR MED REFILL AN LABS

## 2016-10-10 NOTE — Telephone Encounter (Signed)
LMOM FOR PT TO SCHEDULE AN OV WITH A PCP FOR MED REFILL AND BLOOD WORK

## 2016-11-10 ENCOUNTER — Encounter (HOSPITAL_COMMUNITY): Payer: Self-pay | Admitting: Emergency Medicine

## 2016-11-10 ENCOUNTER — Emergency Department (HOSPITAL_COMMUNITY): Payer: BLUE CROSS/BLUE SHIELD

## 2016-11-10 ENCOUNTER — Emergency Department (HOSPITAL_COMMUNITY)
Admission: EM | Admit: 2016-11-10 | Discharge: 2016-11-10 | Disposition: A | Discharge status: Home / Self Care | Payer: BLUE CROSS/BLUE SHIELD | Attending: Emergency Medicine | Admitting: Emergency Medicine

## 2016-11-10 DIAGNOSIS — I10 Essential (primary) hypertension: Secondary | ICD-10-CM | POA: Insufficient documentation

## 2016-11-10 DIAGNOSIS — R5383 Other fatigue: Secondary | ICD-10-CM | POA: Diagnosis not present

## 2016-11-10 DIAGNOSIS — R062 Wheezing: Secondary | ICD-10-CM

## 2016-11-10 DIAGNOSIS — Z79899 Other long term (current) drug therapy: Secondary | ICD-10-CM | POA: Insufficient documentation

## 2016-11-10 DIAGNOSIS — F17200 Nicotine dependence, unspecified, uncomplicated: Secondary | ICD-10-CM | POA: Diagnosis not present

## 2016-11-10 DIAGNOSIS — J45909 Unspecified asthma, uncomplicated: Secondary | ICD-10-CM | POA: Insufficient documentation

## 2016-11-10 DIAGNOSIS — R0789 Other chest pain: Secondary | ICD-10-CM

## 2016-11-10 LAB — I-STAT TROPONIN, ED
TROPONIN I, POC: 0 ng/mL (ref 0.00–0.08)
Troponin i, poc: 0 ng/mL (ref 0.00–0.08)

## 2016-11-10 LAB — I-STAT CHEM 8, ED
BUN: 14 mg/dL (ref 6–20)
CALCIUM ION: 0.97 mmol/L — AB (ref 1.15–1.40)
CHLORIDE: 109 mmol/L (ref 101–111)
Creatinine, Ser: 1 mg/dL (ref 0.61–1.24)
Glucose, Bld: 128 mg/dL — ABNORMAL HIGH (ref 65–99)
HCT: 47 % (ref 39.0–52.0)
Hemoglobin: 16 g/dL (ref 13.0–17.0)
POTASSIUM: 4.2 mmol/L (ref 3.5–5.1)
SODIUM: 139 mmol/L (ref 135–145)
TCO2: 25 mmol/L (ref 0–100)

## 2016-11-10 LAB — CBC
HCT: 47.4 % (ref 39.0–52.0)
HEMOGLOBIN: 15.9 g/dL (ref 13.0–17.0)
MCH: 26.9 pg (ref 26.0–34.0)
MCHC: 33.5 g/dL (ref 30.0–36.0)
MCV: 80.1 fL (ref 78.0–100.0)
PLATELETS: 245 10*3/uL (ref 150–400)
RBC: 5.92 MIL/uL — ABNORMAL HIGH (ref 4.22–5.81)
RDW: 14.9 % (ref 11.5–15.5)
WBC: 6.3 10*3/uL (ref 4.0–10.5)

## 2016-11-10 MED ORDER — ALBUTEROL SULFATE HFA 108 (90 BASE) MCG/ACT IN AERS
2.0000 | INHALATION_SPRAY | Freq: Once | RESPIRATORY_TRACT | Status: AC
  Administered 2016-11-10: 2 via RESPIRATORY_TRACT
  Filled 2016-11-10: qty 13.4

## 2016-11-10 NOTE — ED Triage Notes (Signed)
Pt reports one week of blurred vision, hx of hypertension. Yesterday he was standing and began having needle like feeling to left side of chest. The pain comes and goes.

## 2016-11-10 NOTE — ED Provider Notes (Signed)
[facility]-EMERGENCY DEPT Provider Note   CSN: [id number] Arrival date & time: 11/10/16  0941     History   Chief Complaint Chief Complaint  Patient presents with  . Chest Pain  . Blurred Vision    HPI Casey [name] is a 46 y.o. male.  HPI  Casey [name] is a 46 y.o. male with history of asthma, anemia, hypertension, presents to emergency department complaining of chest pain. Patient reports several episodes of chest pain that started last night. Pain started at rest. Describes it as tightness in the center of the chest. Denies associated shortness of breath, nausea, diaphoresis. He does have history of asthma and states he has been wheezing lately. He does not have an albuterol inhaler, he states he needs a refill but has not seen his doctor yet. He is a smoker. He does have a history of high cholesterol. He is not sure of his family history for heart disease. He is currently being treated for hypertension and states he is compliant with his medications. No chest pain at this time. He denies any exertional symptoms. His wife adds that he has had similar chest pains over the last several weeks and one episode this morning which is what made him come to the ED. No prior cardiac workup or stress test.   Past Medical History:  Diagnosis Date  . Anemia   . Asthma    diagnosed in childhood; age 35.  No hospitalizations.    . Hypertension    onset age 46.    Patient Active Problem List   Diagnosis Date Noted  . Rash, skin 03/15/2016  . Other seasonal allergic rhinitis 01/02/2014  . External hemorrhoids 01/02/2014  . Insomnia 01/02/2014  . Essential hypertension 11/14/2013  . Palpitations 11/14/2013  . Generalized anxiety disorder 11/14/2013  . Asthma 10/24/2012    Past Surgical History:  Procedure Laterality Date  . NO PAST SURGERIES         Home Medications    Prior to Admission medications   Medication Sig Start Date End Date Taking? Authorizing Provider  albuterol  (PROVENTIL) (2.5 MG/3ML) 0.083% nebulizer solution USE 1 VIAL VIA NEBULIZER Q 6 H PRF WHEEZING OR SOB 01/18/16  Yes Jeffery, Chelle, PA-C  amLODipine (NORVASC) 5 MG tablet TAKE 1 TABLET BY MOUTH DAILY Patient taking differently: TAKE 5 mg  BY MOUTH DAILY 10/09/16  Yes Jeffery, Chelle, PA-C  Aspirin-Salicylamide-Caffeine (BC HEADACHE POWDER PO) Take 1 Package by mouth daily as needed (for chest pain).   Yes [provider]  Dextromethorphan-Guaifenesin (MUCINEX DM MAXIMUM STRENGTH) 60-1200 MG TB12 Take 1 tablet by mouth every 12 (twelve) hours. Patient taking differently: Take 1 tablet by mouth every 12 (twelve) hours as needed (for allgery in the winter).  04/18/16  Yes McVey, Madelaine Bhat, PA-C  fexofenadine (ALLEGRA) 60 MG tablet Take 60 mg by mouth daily as needed for allergies or rhinitis.   Yes [provider]  montelukast (SINGULAIR) 10 MG tablet TAKE 1 TABLET(10 MG) BY MOUTH AT BEDTIME 09/27/16  Yes McVey, Madelaine Bhat, PA-C  clobetasol (TEMOVATE) 0.05 % external solution Apply 1 application topically 2 (two) times daily. For maximum 10 days. Patient not taking: Reported on 11/10/2016 01/18/16   Porfirio Oar, PA-C  fluticasone (FLONASE) 50 MCG/ACT nasal spray Place 2 sprays into both nostrils daily. Patient not taking: Reported on 11/10/2016 04/18/16   McVey, Madelaine Bhat, PA-C  fluticasone (FLOVENT HFA) 110 MCG/ACT inhaler Inhale 2 puffs into the lungs 2 (two) times daily.  Patient not taking: Reported on 11/10/2016 06/30/15   Emi Belfast, FNP  Spacer/Aero-Holding Chambers (AEROCHAMBER PLUS) inhaler Use as instructed Patient not taking: Reported on 04/18/2016 03/02/16   Porfirio Oar, PA-C  VENTOLIN HFA 108 (90 Base) MCG/ACT inhaler INHALE 1 TO 2 PUFFS INTO THE LUNGS EVERY 6 HOURS AS NEEDED Patient not taking: Reported on 11/10/2016 09/30/16   Magdalene River, PA-C    Family History Family History  Problem Relation Age of Onset  . Hypertension Father     . Stroke Father 5       CVA; recurrent CVA at time of death  . Asthma Brother     Social History Social History  Substance Use Topics  . Smoking status: Current Every Day Smoker    Last attempt to quit: 05/05/2012  . Smokeless tobacco: Never Used     Comment: working to cut back  . Alcohol use 0.0 - 2.4 oz/week     Comment: most weeks doesn't drink at all     Allergies   Other   Review of Systems Review of Systems  Constitutional: Negative for chills and fever.  Respiratory: Positive for chest tightness. Negative for cough and shortness of breath.   Cardiovascular: Positive for chest pain. Negative for palpitations and leg swelling.  Gastrointestinal: Negative for abdominal distention, abdominal pain, diarrhea, nausea and vomiting.  Genitourinary: Negative for dysuria, frequency, hematuria and urgency.  Musculoskeletal: Negative for arthralgias, myalgias, neck pain and neck stiffness.  Skin: Negative for rash.  Allergic/Immunologic: Negative for immunocompromised state.  Neurological: Negative for dizziness, weakness, light-headedness, numbness and headaches.  All other systems reviewed and are negative.    Physical Exam Updated Vital Signs BP (!) 140/99   Pulse 75   Temp 98.7 F (37.1 C) (Oral)   Resp 15   Ht 5\' 10"  (1.778 m)   Wt 85.7 kg (189 lb)   SpO2 97%   BMI 27.12 kg/m   Physical Exam  Constitutional: He is oriented to person, place, and time. He appears well-developed and well-nourished. No distress.  HENT:  Head: Normocephalic and atraumatic.  Eyes: Conjunctivae are normal.  Neck: Neck supple.  Cardiovascular: Normal rate, regular rhythm and normal heart sounds.   Pulmonary/Chest: Effort normal. No respiratory distress. He has wheezes. He has no rales. He exhibits no tenderness.  Expiratory wheezes bilaterally  Abdominal: Soft. Bowel sounds are normal. He exhibits no distension. There is no tenderness. There is no rebound.  Musculoskeletal: He  exhibits no edema.  Neurological: He is alert and oriented to person, place, and time.  Skin: Skin is warm and dry.  Nursing note and vitals reviewed.    ED Treatments / Results  Labs (all labs ordered are listed, but only abnormal results are displayed) Labs Reviewed  CBC - Abnormal; Notable for the following:       Result Value   RBC 5.92 (*)    All other components within normal limits  I-STAT CHEM 8, ED - Abnormal; Notable for the following:    Glucose, Bld 128 (*)    Calcium, Ion 0.97 (*)    All other components within normal limits  BASIC METABOLIC PANEL  BASIC METABOLIC PANEL  I-STAT TROPONIN, ED  I-STAT TROPONIN, ED    EKG  EKG Interpretation  Date/Time:  Thursday November 10 2016 09:43:20 EDT Ventricular Rate:  81 PR Interval:  146 QRS Duration: 94 QT Interval:  400 QTC Calculation: 464 R Axis:   79 Text Interpretation:  Normal sinus rhythm  Left ventricular hypertrophy Abnormal ECG Confirmed by Vanetta MuldersZackowski, Scott [phone]([id number]) on 11/10/2016 2:30:53 PM       Radiology Dg Chest 2 View  Result Date: 11/10/2016 CLINICAL DATA:  Chest pain EXAM: CHEST  2 VIEW COMPARISON:  03/02/2016 FINDINGS: Normal heart size. External objects project over the left upper chest and left chest side wall. Lungs clear. No pneumothorax. No pleural effusion. IMPRESSION: No active cardiopulmonary disease. Electronically Signed   By: Jolaine ClickArthur  Hoss M.D.   On: 11/10/2016 10:14    Procedures Procedures (including critical care time)  Medications Ordered in ED Medications  albuterol (PROVENTIL HFA;VENTOLIN HFA) 108 (90 Base) MCG/ACT inhaler 2 puff (not administered)     Initial Impression / Assessment and Plan / ED Course  I have reviewed the triage vital signs and the nursing notes.  Pertinent labs & imaging results that were available during my care of the patient were reviewed by me and considered in my medical decision making (see chart for details).     Patient emergency department with  nonspecific chest pains onset yesterday. Pain comes and goes. It is nonexertional. He is wheezing on exam. No GERD symptoms. Initial troponin obtained at triage is negative. Normal CBC and chest x-ray. Vital signs are normal. We'll recheck a second troponin. Patient is low risk cardiac disease. His heart score is 2. He is a smoker, has history hypertension, otherwise no risk factors. He may have elevated cholesterol but he is not on any medications, states was told to eat better. We discussed smoking cessation. We'll monitor until we can obtain second troponin.   2:29 PM Second troponin is 0.00. Patient reassured. We'll have him follow-up with cardiology for further evaluation. Patient also expressed to me that he has been more fatigued than usual and states that he cannot stay asleep at night and sometimes waking up gasping for breath. We discussed possible sleep apnea as the cause of this. I will have him follow-up with family doctor to schedule a sleep study. Patient is stable for discharge home at this time. Return precautions discussed.  Vitals:   11/10/16 1215 11/10/16 1245 11/10/16 1330 11/10/16 1416  BP: (!) 151/94 (!) 140/99 (!) 136/98 (!) 136/95  Pulse: 81 75 76 86  Resp: 15 15 17 20   Temp:      TempSrc:      SpO2: 98% 97% 99% 98%  Weight:      Height:         Final Clinical Impressions(s) / ED Diagnoses   Final diagnoses:  Atypical chest pain  Fatigue, unspecified type  Wheezing    New Prescriptions Discharge Medication List as of 11/10/2016  2:27 PM       Jaynie CrumbleKirichenko, Rajni Holsworth, PA-C 11/10/16 1653    Vanetta MuldersZackowski, Scott, MD 11/11/16 [id number]

## 2016-11-10 NOTE — Discharge Instructions (Signed)
Please follow-up with cardiology regarding your chest pain for further evaluation. Please call and follow-up with family doctor for further evaluation of your fatigue, sleeping problems. Return if worsening symptoms. Use inhaler 2 puffs every 4 hours as needed for wheezing.

## 2016-12-06 ENCOUNTER — Encounter: Payer: Self-pay | Admitting: Physician Assistant

## 2016-12-06 ENCOUNTER — Ambulatory Visit (INDEPENDENT_AMBULATORY_CARE_PROVIDER_SITE_OTHER): Payer: BLUE CROSS/BLUE SHIELD | Admitting: Physician Assistant

## 2016-12-06 VITALS — BP 144/99 | HR 88 | Temp 97.6°F | Resp 16 | Ht 70.0 in | Wt 185.6 lb

## 2016-12-06 DIAGNOSIS — R030 Elevated blood-pressure reading, without diagnosis of hypertension: Secondary | ICD-10-CM

## 2016-12-06 DIAGNOSIS — L218 Other seborrheic dermatitis: Secondary | ICD-10-CM

## 2016-12-06 DIAGNOSIS — J45909 Unspecified asthma, uncomplicated: Secondary | ICD-10-CM | POA: Diagnosis not present

## 2016-12-06 DIAGNOSIS — L210 Seborrhea capitis: Secondary | ICD-10-CM

## 2016-12-06 MED ORDER — CLOBETASOL PROPIONATE 0.05 % EX SOLN
1.0000 | Freq: Two times a day (BID) | CUTANEOUS | 0 refills | Status: DC
Start: 2016-12-06 — End: 2017-01-09

## 2016-12-06 MED ORDER — IPRATROPIUM BROMIDE 0.02 % IN SOLN
0.5000 mg | Freq: Once | RESPIRATORY_TRACT | Status: DC
Start: 2016-12-06 — End: 2016-12-06

## 2016-12-06 MED ORDER — ALBUTEROL SULFATE (2.5 MG/3ML) 0.083% IN NEBU: INHALATION_SOLUTION | RESPIRATORY_TRACT | 3 refills | Status: DC

## 2016-12-06 MED ORDER — ALBUTEROL SULFATE HFA 108 (90 BASE) MCG/ACT IN AERS: 1.0000 | INHALATION_SPRAY | Freq: Four times a day (QID) | RESPIRATORY_TRACT | 0 refills | Status: DC | PRN

## 2016-12-06 MED ORDER — ALBUTEROL SULFATE (2.5 MG/3ML) 0.083% IN NEBU: 2.5000 mg | INHALATION_SOLUTION | Freq: Once | RESPIRATORY_TRACT | Status: DC

## 2016-12-06 NOTE — Progress Notes (Signed)
12/06/2016 11:33 AM   DOB: [birth year] / MRN: [id number]  SUBJECTIVE:  Casey [name] is a 46 y.o. male presenting for asthma. Needs several inhalers. Does not take an ICS. Wakes up with wheezing about every night in the summer time.  Refills his albuterol inhaler once every 2 months but more in the summer.   Needs several refills today. Tells me that he has been working on his smoking.  Tells me he is down to two daily and smokes one in the morning and at night.      He is allergic to other.   He  has a past medical history of Anemia; Asthma; and Hypertension.    He  reports that he has been smoking.  He has never used smokeless tobacco. He reports that he drinks alcohol. He reports that he does not use drugs. He  reports that he currently engages in sexual activity. The patient  has a past surgical history that includes No past surgeries.  His family history includes Asthma in his brother; Hypertension in his father; Stroke (age of onset: 79) in his father.  Review of Systems  Constitutional: Negative for chills and fever.  Respiratory: Positive for cough, shortness of breath and wheezing. Negative for hemoptysis and sputum production.   Cardiovascular: Negative for chest pain.  Gastrointestinal: Negative for nausea.  Skin: Positive for itching (scalp). Negative for rash.  Neurological: Negative for dizziness.    The problem list and medications were reviewed and updated by myself where necessary and exist elsewhere in the encounter.   OBJECTIVE:  BP (!) 144/99 (BP Location: Right Arm, Patient Position: Sitting, Cuff Size: Large)   Pulse 88   Temp 97.6 F (36.4 C) (Oral)   Resp 16   Ht 5\' 10"  (1.778 m)   Wt 185 lb 9.6 oz (84.2 kg)   SpO2 98%   BMI 26.63 kg/m   BP Readings from Last 3 Encounters:  12/06/16 (!) 144/99  11/10/16 (!) 136/95  04/18/16 132/80     Physical Exam  Constitutional: He is oriented to person, place, and time. He appears well-developed. He is  active and cooperative.  Non-toxic appearance.  Eyes: Pupils are equal, round, and reactive to light. EOM are normal.  Cardiovascular: Normal rate, regular rhythm, S1 normal, S2 normal, normal heart sounds, intact distal pulses and normal pulses.  Exam reveals no gallop and no friction rub.   No murmur heard. Pulmonary/Chest: Effort normal. No stridor. No tachypnea. No respiratory distress. He has wheezes (resolved with nebs). He has no rales.  Abdominal: He exhibits no distension.  Musculoskeletal: He exhibits no edema.  Neurological: He is alert and oriented to person, place, and time. He has normal strength and normal reflexes. He is not disoriented. No cranial nerve deficit or sensory deficit. He exhibits normal muscle tone. Coordination and gait normal.  Skin: Skin is warm and dry. Rash (dandruff) noted. He is not diaphoretic. No pallor.  Psychiatric: His behavior is normal.  Vitals reviewed.   No results found for this or any previous visit (from the past 72 hour(s)).  No results found.  ASSESSMENT AND PLAN:  Casey [name] was seen today for medication refill.  Diagnoses and all orders for this visit:  Uncomplicated asthma, unspecified asthma severity, unspecified whether persistent: Smoker.  Will push a little harder next visit to push him towards quitting.  Not taking a ICS and has daily symptoms.  I have stressed the importance of ICS therapy and compliance. Other meds  refilled.  -     albuterol (PROVENTIL) (2.5 MG/3ML) 0.083% nebulizer solution; USE 1 VIAL VIA NEBULIZER Q 6 H PRF WHEEZING OR SOB -     albuterol (VENTOLIN HFA) 108 (90 Base) MCG/ACT inhaler; Inhale 1-2 puffs into the lungs every 6 (six) hours as needed for wheezing or shortness of breath. -     albuterol (PROVENTIL) (2.5 MG/3ML) 0.083% nebulizer solution 2.5 mg; Take 3 mLs (2.5 mg total) by nebulization once. -     ipratropium (ATROVENT) nebulizer solution 0.5 mg; Take 2.5 mLs (0.5 mg total) by nebulization once. -      Care order/instruction:  Elevated blood pressure reading: Diary. One month RTC.   Dandruff in adult -     clobetasol (TEMOVATE) 0.05 % external solution; Apply 1 application topically 2 (two) times daily. For maximum 10 days.    The patient is advised to call or return to clinic if he does not see an improvement in symptoms, or to seek the care of the closest emergency department if he worsens with the above plan.   Deliah BostonMichael Traxton Kolenda, MHS, PA-C [facility] at [facility] 12/06/2016 11:33 AM

## 2016-12-06 NOTE — Patient Instructions (Signed)
Please monitor you blood pressure at home and keep a written diary.  If your blood pressure is greater than 140/90 consistently then I would like to see you back in clinic for discussion of the numbers and possibly starting/changing a blood pressure medication. The best way to manage BP without a medication is 150 minutes of walking weekly and eating a diet low in salt and high in whole fruits and vegetables. Pursuing this healthy lifestyle will also decrease the risk of other chronic conditions, such as diabetes, obesity, stroke and heart attack.    Please do not miss doses of you flovent, as this medication is the key to helping you asthma become controlled.     Use Head and Shoulders daily in your hair.  I have represcribed the steroid shampoo.

## 2016-12-30 ENCOUNTER — Other Ambulatory Visit: Payer: Self-pay | Admitting: Physician Assistant

## 2016-12-30 DIAGNOSIS — J45909 Unspecified asthma, uncomplicated: Secondary | ICD-10-CM

## 2017-01-06 ENCOUNTER — Other Ambulatory Visit: Payer: Self-pay | Admitting: Physician Assistant

## 2017-01-09 ENCOUNTER — Ambulatory Visit (INDEPENDENT_AMBULATORY_CARE_PROVIDER_SITE_OTHER): Payer: BLUE CROSS/BLUE SHIELD | Admitting: Physician Assistant

## 2017-01-09 ENCOUNTER — Ambulatory Visit (INDEPENDENT_AMBULATORY_CARE_PROVIDER_SITE_OTHER): Payer: BLUE CROSS/BLUE SHIELD

## 2017-01-09 ENCOUNTER — Encounter: Payer: Self-pay | Admitting: Physician Assistant

## 2017-01-09 VITALS — BP 130/82 | HR 88 | Temp 98.5°F | Resp 16 | Ht 70.0 in | Wt 186.4 lb

## 2017-01-09 DIAGNOSIS — M545 Low back pain, unspecified: Secondary | ICD-10-CM

## 2017-01-09 DIAGNOSIS — J45909 Unspecified asthma, uncomplicated: Secondary | ICD-10-CM | POA: Diagnosis not present

## 2017-01-09 DIAGNOSIS — I10 Essential (primary) hypertension: Secondary | ICD-10-CM | POA: Diagnosis not present

## 2017-01-09 DIAGNOSIS — G8929 Other chronic pain: Secondary | ICD-10-CM

## 2017-01-09 DIAGNOSIS — L210 Seborrhea capitis: Secondary | ICD-10-CM | POA: Diagnosis not present

## 2017-01-09 MED ORDER — CLOBETASOL PROPIONATE 0.05 % EX SHAM: MEDICATED_SHAMPOO | CUTANEOUS | 11 refills | Status: AC

## 2017-01-09 MED ORDER — ALBUTEROL SULFATE HFA 108 (90 BASE) MCG/ACT IN AERS: INHALATION_SPRAY | RESPIRATORY_TRACT | 3 refills | Status: DC

## 2017-01-09 MED ORDER — AMLODIPINE BESYLATE 5 MG PO TABS: 5.0000 mg | ORAL_TABLET | Freq: Every day | ORAL | 3 refills | Status: DC

## 2017-01-09 MED ORDER — FLUTICASONE PROPIONATE HFA 220 MCG/ACT IN AERO: 2.0000 | INHALATION_SPRAY | Freq: Two times a day (BID) | RESPIRATORY_TRACT | 12 refills | Status: DC

## 2017-01-09 MED ORDER — TRIAMCINOLONE ACETONIDE 0.1 % EX CREA: 1.0000 "application " | TOPICAL_CREAM | Freq: Two times a day (BID) | CUTANEOUS | 0 refills | Status: AC

## 2017-01-09 NOTE — Patient Instructions (Addendum)
Take tylenol 1000 mg every 8 hours for pain.   Don't miss doses of your inhaler.     Come back in early January.

## 2017-01-09 NOTE — Progress Notes (Signed)
o   01/09/2017 8:39 AM   DOB: 1971-03-08 / MRN: [id number]  SUBJECTIVE:  Casey [name] is a 46 y.o. male presenting for asthma follow up.  Tells me that he has been on and off with his flovent and thinks this is helping but has not been using this regularly.  Has been out now for about 2 weeks. Tells me that even with Flovent he still needs rescue inhaler daily.   Took he Norvasc last dose was this morning. Denies chest pain, SOB, leg swelling.    Continues to smoke about 3-4 cigarettes in a 24 hour period.  No quite ready to quit.   Complains of right sided back pain for the last year.  Pain radiates down the back of his right leg.  This problem waxes and wanes.   He is allergic to other.   He  has a past medical history of Anemia; Asthma; and Hypertension.    He  reports that he has been smoking.  He has never used smokeless tobacco. He reports that he drinks alcohol. He reports that he does not use drugs. He  reports that he currently engages in sexual activity. The patient  has a past surgical history that includes No past surgeries.  His family history includes Asthma in his brother; Hypertension in his father; Stroke (age of onset: 30) in his father.  Review of Systems  Constitutional: Negative for chills, diaphoresis and fever.  Respiratory: Negative for shortness of breath.   Cardiovascular: Negative for chest pain, orthopnea and leg swelling.  Gastrointestinal: Negative for nausea.  Skin: Negative for rash.  Neurological: Negative for dizziness.    The problem list and medications were reviewed and updated by myself where necessary and exist elsewhere in the encounter.   OBJECTIVE:  BP 130/82 (BP Location: Left Arm, Patient Position: Sitting, Cuff Size: Large)   Pulse 88   Temp 98.5 F (36.9 C) (Oral)   Resp 16   Ht  (1.778 m)   Wt 186 lb 6.4 oz (84.6 kg)   SpO2 98%   BMI 26.75 kg/m   Physical Exam  Constitutional: He is oriented to person, place, and  time. He appears well-developed. He is active and cooperative.  Non-toxic appearance.  Cardiovascular: Normal rate and regular rhythm.   Pulmonary/Chest: Effort normal and breath sounds normal. No tachypnea.  Musculoskeletal: Normal range of motion. He exhibits no edema, tenderness or deformity.  Neurological: He is alert and oriented to person, place, and time. No cranial nerve deficit. Coordination normal.  Skin: Skin is warm and dry. He is not diaphoretic. No pallor.  Vitals reviewed.  Lab Results  Component Value Date   HGBA1C 6.0 (H) 06/30/2015     No results found for this or any previous visit (from the past 72 hour(s)).  No results found.  ASSESSMENT AND PLAN:  Deuntae was seen today for follow-up and medication refill.  Diagnoses and all orders for this visit:  Essential hypertension: Controlled.  Will lab and follow.  -     amLODipine (NORVASC) 5 MG tablet; Take 1 tablet (5 mg total) by mouth daily. -     CBC -     TSH -     Hemoglobin A1c -     CMP and Liver  Uncomplicated asthma, unspecified asthma severity, unspecified whether persistent: Increasing his dose per HPI. Lungs sound normal today.  Advised that he be persistent here with regard to IC. He continues to smoke.  Hopefully he  will stop.   -     fluticasone (FLOVENT HFA) 220 MCG/ACT inhaler; Inhale 2 puffs into the lungs 2 (two) times daily. -     albuterol (VENTOLIN HFA) 108 (90 Base) MCG/ACT inhaler; INHALE 1 TO 2 PUFFS INTO THE LUNGS EVERY 6 HOURS AS NEEDED FOR WHEEZING OR SHORTNESS OF BREATH  Chronic right-sided low back pain without sciatica: Mentioned in passing and does not seem to be a big problem for him.  Comments: Take tylenol 1000 mg every 8 hours for back pain.   Orders: -     DG Lumbar Spine 2-3 Views; Future  Dandruff in adult: Failed otc shampoo. Will step up therapy. Triamcinolone for trouble spots.  -     Clobetasol Propionate 0.05 % shampoo; Use twice daily in the shower. -      triamcinolone cream (KENALOG) 0.1 %; Apply 1 application topically 2 (two) times daily.    The patient is advised to call or return to clinic if he does not see an improvement in symptoms, or to seek the care of the closest emergency department if he worsens with the above plan.   Deliah Boston, MHS, PA-C Primary Care at [facility] 01/09/2017 8:39 AM

## 2017-01-10 LAB — CMP AND LIVER
ALK PHOS: 88 IU/L (ref 39–117)
ALT: 46 IU/L — AB (ref 0–44)
AST: 45 IU/L — AB (ref 0–40)
Albumin: 4 g/dL (ref 3.5–5.5)
BILIRUBIN TOTAL: 0.5 mg/dL (ref 0.0–1.2)
BILIRUBIN, DIRECT: 0.42 mg/dL — AB (ref 0.00–0.40)
BUN: 8 mg/dL (ref 6–24)
CHLORIDE: 102 mmol/L (ref 96–106)
CO2: 19 mmol/L — AB (ref 20–29)
Calcium: 8.8 mg/dL (ref 8.7–10.2)
Creatinine, Ser: 1.14 mg/dL (ref 0.76–1.27)
GFR calc Af Amer: 91 mL/min/{1.73_m2} (ref >59)
GFR calc non Af Amer: 78 mL/min/{1.73_m2} (ref >59)
Glucose: 104 mg/dL — ABNORMAL HIGH (ref 65–99)
Potassium: 3.6 mmol/L (ref 3.5–5.2)
Sodium: 141 mmol/L (ref 134–144)
TOTAL PROTEIN: 6.9 g/dL (ref 6.0–8.5)

## 2017-01-10 LAB — CBC
Hematocrit: 45.4 % (ref 37.5–51.0)
Hemoglobin: 15.7 g/dL (ref 13.0–17.7)
MCH: 27.4 pg (ref 26.6–33.0)
MCHC: 34.6 g/dL (ref 31.5–35.7)
MCV: 79 fL (ref 79–97)
Platelets: 227 10*3/uL (ref 150–379)
RBC: 5.74 x10E6/uL (ref 4.14–5.80)
RDW: 15.9 % — AB (ref 12.3–15.4)
WBC: 6.5 10*3/uL (ref 3.4–10.8)

## 2017-01-10 LAB — TSH: TSH: 2.04 u[IU]/mL (ref 0.450–4.500)

## 2017-01-10 LAB — HEMOGLOBIN A1C
Est. average glucose Bld gHb Est-mCnc: 120 mg/dL
HEMOGLOBIN A1C: 5.8 % — AB (ref 4.8–5.6)

## 2017-03-09 ENCOUNTER — Other Ambulatory Visit: Payer: Self-pay | Admitting: Physician Assistant

## 2017-03-09 DIAGNOSIS — J45909 Unspecified asthma, uncomplicated: Secondary | ICD-10-CM

## 2017-04-13 ENCOUNTER — Other Ambulatory Visit: Payer: Self-pay | Admitting: Physician Assistant

## 2017-04-13 DIAGNOSIS — J45909 Unspecified asthma, uncomplicated: Secondary | ICD-10-CM

## 2017-05-25 ENCOUNTER — Other Ambulatory Visit: Payer: Self-pay | Admitting: Physician Assistant

## 2017-05-25 DIAGNOSIS — J45909 Unspecified asthma, uncomplicated: Secondary | ICD-10-CM

## 2017-05-29 ENCOUNTER — Other Ambulatory Visit: Payer: Self-pay | Admitting: Physician Assistant

## 2017-05-29 DIAGNOSIS — J45909 Unspecified asthma, uncomplicated: Secondary | ICD-10-CM

## 2017-06-19 ENCOUNTER — Other Ambulatory Visit: Payer: Self-pay

## 2017-06-19 ENCOUNTER — Other Ambulatory Visit: Payer: Self-pay | Admitting: Physician Assistant

## 2017-06-19 DIAGNOSIS — J45909 Unspecified asthma, uncomplicated: Secondary | ICD-10-CM

## 2017-06-19 NOTE — Telephone Encounter (Signed)
Pt has appt scheduled for 3/19.  LOV: 01/09/17  Lilia ArgueSarah [name],PA

## 2017-06-20 ENCOUNTER — Ambulatory Visit: Payer: BLUE CROSS/BLUE SHIELD | Admitting: Physician Assistant

## 2017-06-20 ENCOUNTER — Other Ambulatory Visit: Payer: Self-pay

## 2017-06-20 ENCOUNTER — Encounter: Payer: Self-pay | Admitting: Physician Assistant

## 2017-06-20 VITALS — BP 136/88 | HR 98 | Temp 98.1°F | Resp 18 | Ht 70.0 in | Wt 184.4 lb

## 2017-06-20 DIAGNOSIS — H539 Unspecified visual disturbance: Secondary | ICD-10-CM | POA: Diagnosis not present

## 2017-06-20 DIAGNOSIS — K429 Umbilical hernia without obstruction or gangrene: Secondary | ICD-10-CM | POA: Diagnosis not present

## 2017-06-20 DIAGNOSIS — I10 Essential (primary) hypertension: Secondary | ICD-10-CM | POA: Diagnosis not present

## 2017-06-20 DIAGNOSIS — R519 Headache, unspecified: Secondary | ICD-10-CM

## 2017-06-20 DIAGNOSIS — R51 Headache: Secondary | ICD-10-CM

## 2017-06-20 DIAGNOSIS — J45909 Unspecified asthma, uncomplicated: Secondary | ICD-10-CM

## 2017-06-20 MED ORDER — ALBUTEROL SULFATE HFA 108 (90 BASE) MCG/ACT IN AERS
1.0000 | INHALATION_SPRAY | RESPIRATORY_TRACT | 1 refills | Status: DC | PRN
Start: 2017-06-20 — End: 2017-08-01

## 2017-06-20 MED ORDER — FLUTICASONE PROPIONATE HFA 220 MCG/ACT IN AERO
2.0000 | INHALATION_SPRAY | Freq: Two times a day (BID) | RESPIRATORY_TRACT | 2 refills | Status: AC
Start: 2017-06-20 — End: ?

## 2017-06-20 NOTE — Progress Notes (Signed)
Casey [name]  MRN: [id number] DOB: 12-26-1970  PCP: Morrell Riddle, PA-C  Chief Complaint  Patient presents with  . Abdominal Pain    pt believes its a hernia     Subjective:  Pt presents to clinic for concerns about a swollen area around his navel.  Wife is worried that he has a hernia. Sometimes it gets bigger and then other times it gets smaller.  He 1st noticed this about 6 months.  For periods of time it will go away.  He has intermittent heartburn but he has paid attention to their relationship.  He has done nothing for it.  He is a Paediatric nurse.  He does not remember an injury to the area.    For his past ulcers - he tries to decrease his spicy foods, decrease his stress and drink more water.  He is not having those symptoms currently.  H/o ulcers, he was admitted to [facility] for these ulcers.  Family history of ulcers.   He is also having some headaches and visual changes - intermittent days and times of days.  Most of the pain is in his neck with his headaches.  Even headaches and vision changes are not always related.  Sometime headaches with exercise but never with sex.  Needs refills on his inhalers.  He uses albuterol daily and his flovent at least once a day but sometimes does forget his evening dose.  He knows it is allergy season and he wants to be prepared.  History is obtained by patient.  Review of Systems  Eyes: Positive for visual disturbance (intermittent). Negative for photophobia.  Gastrointestinal: Negative.  Negative for abdominal pain.  Musculoskeletal: Positive for myalgias (neck area - on the right side) and neck pain.  Neurological: Positive for headaches. Negative for dizziness.  Psychiatric/Behavioral: Negative for self-injury. The patient is nervous/anxious (gets worried about things easily).     Patient Active Problem List   Diagnosis Date Noted  . Rash, skin 03/15/2016  . Other seasonal allergic rhinitis 01/02/2014  . External  hemorrhoids 01/02/2014  . Insomnia 01/02/2014  . Essential hypertension 11/14/2013  . Palpitations 11/14/2013  . Generalized anxiety disorder 11/14/2013  . Asthma 10/24/2012    Current Outpatient Medications on File Prior to Visit  Medication Sig Dispense Refill  . albuterol (PROVENTIL) (2.5 MG/3ML) 0.083% nebulizer solution USE 1 VIAL VIA NEBULIZER Q 6 H PRF WHEEZING OR SOB 75 mL 3  . amLODipine (NORVASC) 5 MG tablet Take 1 tablet (5 mg total) by mouth daily. 90 tablet 3  . Aspirin-Salicylamide-Caffeine (BC HEADACHE POWDER PO) Take 1 Package by mouth daily as needed (for chest pain).    . Clobetasol Propionate 0.05 % shampoo Use twice daily in the shower. 118 mL 11  . fexofenadine (ALLEGRA) 60 MG tablet Take 60 mg by mouth daily as needed for allergies or rhinitis.    Marland Kitchen triamcinolone cream (KENALOG) 0.1 % Apply 1 application topically 2 (two) times daily. 30 g 0   No current facility-administered medications on file prior to visit.     Allergies  Allergen Reactions  . Other Other (See Comments)    Pecan: ashma attack    Past Medical History:  Diagnosis Date  . Anemia   . Asthma    diagnosed in childhood; age 4.  No hospitalizations.    . Hypertension    onset age 62.   Social History   Social History Narrative   Marital status:  Married x 20  years.      Children: 2 sons (20, 8017); no grandchildren.      Lives: with wife, 2 sons      Employment:  [employer].      Tobacco: socially; 1-2 cigarettes per day.      Alcohol: occasionally      Drugs: none      Exercise:  No; sporadic      Seatbelt:  100%      Guns:  none   Social History   Tobacco Use  . Smoking status: Current Every Day Smoker    Packs/day: 0.25    Last attempt to quit: 05/05/2012    Years since quitting: 5.1  . Smokeless tobacco: Never Used  . Tobacco comment: working to cut back  Substance Use Topics  . Alcohol use: Yes    Alcohol/week: 0.0 - 2.4 oz    Comment: most weeks doesn't drink at all  . Drug  use: No   family history includes Asthma in his brother; Hypertension in his father; Stroke (age of onset: 4250) in his father.     Objective:  BP 136/88   Pulse 98   Temp 98.1 F (36.7 C) (Oral)   Resp 18   Ht 5\' 10"  (1.778 m)   Wt 184 lb 6.4 oz (83.6 kg)   SpO2 97%   BMI 26.46 kg/m  Body mass index is 26.46 kg/m.  Physical Exam  Constitutional: He is oriented to person, place, and time and well-developed, well-nourished, and in no distress.  HENT:  Head: Normocephalic and atraumatic.  Right Ear: External ear normal.  Left Ear: External ear normal.  Eyes: Conjunctivae are normal.  Neck: Normal range of motion.  Cardiovascular: Normal rate, regular rhythm and normal heart sounds.  No murmur heard. Pulmonary/Chest: Effort normal and breath sounds normal. He has no wheezes.  Abdominal: A hernia is present. Hernia confirmed positive in the umbilical area (defect palpable - reducible).  Musculoskeletal:       Cervical back: He exhibits spasm (right trapezius - TTP ).  Neurological: He is alert and oriented to person, place, and time. Gait normal.  Skin: Skin is warm and dry.  Psychiatric: Mood, memory, affect and judgment normal.    Assessment and Plan :  Umbilical hernia without obstruction and without gangrene - Plan: Ambulatory referral to General Surgery  Uncomplicated asthma, unspecified asthma severity, unspecified whether persistent - Plan: albuterol (VENTOLIN HFA) 108 (90 Base) MCG/ACT inhaler, fluticasone (FLOVENT HFA) 220 MCG/ACT inhaler  Essential hypertension - controlled but high normal - get a BP cuff to monitor symptoms of headache, vision changes and BP  Frequent headaches - monitor and keep a diary  Vision changes - see ophthalmologist  F/u with me regarding headaches and vision changes after he has kept a dairy for 204 weeks - we talked about warning signs - he has a trapezius muscle spasm which is where he feels his headaches start - we talked about ways  to help this - massage relaxation techniques.  Benny LennertSarah Hideko Esselman PA-C  Primary Care at [facility] 06/20/2017 1:00 PM

## 2017-06-20 NOTE — Patient Instructions (Addendum)
  Ophthalmology - digby or groat BP cuff - Omron   IF you received an x-ray today, you will receive an invoice from [facility]. Please contact [facility] at [phone] with questions or concerns regarding your invoice.   IF you received labwork today, you will receive an invoice from [facility]. Please contact [facility] at [phone] with questions or concerns regarding your invoice.   Our billing staff will not be able to assist you with questions regarding bills from these companies.  You will be contacted with the lab results as soon as they are available. The fastest way to get your results is to activate your My Chart account. Instructions are located on the last page of this paperwork. If you have not heard from us regarding the results in 2 weeks, please contact this office.

## 2017-07-13 ENCOUNTER — Ambulatory Visit: Payer: BLUE CROSS/BLUE SHIELD | Admitting: Family Medicine

## 2017-07-13 ENCOUNTER — Encounter: Payer: Self-pay | Admitting: Family Medicine

## 2017-07-13 ENCOUNTER — Other Ambulatory Visit: Payer: Self-pay

## 2017-07-13 VITALS — BP 150/101 | HR 96 | Temp 99.1°F | Resp 16 | Ht 70.0 in | Wt 182.6 lb

## 2017-07-13 DIAGNOSIS — R252 Cramp and spasm: Secondary | ICD-10-CM | POA: Diagnosis not present

## 2017-07-13 MED ORDER — MELOXICAM 7.5 MG PO TABS: 7.5000 mg | ORAL_TABLET | Freq: Every day | ORAL | 0 refills | Status: DC

## 2017-07-13 MED ORDER — BACLOFEN 10 MG PO TABS: 10.0000 mg | ORAL_TABLET | Freq: Three times a day (TID) | ORAL | 0 refills | Status: DC

## 2017-07-13 NOTE — Patient Instructions (Addendum)
Aspercreme with lidocaine or Tiger Balm applied to the back    IF you received an x-ray today, you will receive an invoice from [facility]. Please contact [facility] at [phone] with questions or concerns regarding your invoice.   IF you received labwork today, you will receive an invoice from [facility]. Please contact [facility] at [phone] with questions or concerns regarding your invoice.   Our billing staff will not be able to assist you with questions regarding bills from these companies.  You will be contacted with the lab results as soon as they are available. The fastest way to get your results is to activate your My Chart account. Instructions are located on the last page of this paperwork. If you have not heard from us regarding the results in 2 weeks, please contact this office.     Muscle Cramps and Spasms Muscle cramps and spasms occur when a muscle or muscles tighten and you have no control over this tightening (involuntary muscle contraction). They are a common problem and can develop in any muscle. The most common place is in the calf muscles of the leg. Muscle cramps and muscle spasms are both involuntary muscle contractions, but there are some differences between the two:  Muscle cramps are painful. They come and go and may last a few seconds to 15 minutes. Muscle cramps are often more forceful and last longer than muscle spasms.  Muscle spasms may or may not be painful. They may also last just a few seconds or much longer.  Certain medical conditions, such as diabetes or Parkinson disease, can make it more likely to develop cramps or spasms. However, cramps or spasms are usually not caused by a serious underlying problem. Common causes include:  Overexertion.  Overuse from repetitive motions, or doing the same thing over and over.  Remaining in a certain position for a long period of time.  Improper preparation, form, or technique while playing  a sport or doing an activity.  Dehydration.  Injury.  Side effects of some medicines.  Abnormally low levels of the salts and ions in your blood (electrolytes), especially potassium and calcium. This could happen if you are taking water pills (diuretics) or if you are pregnant.  In many cases, the cause of muscle cramps or spasms is unknown. Follow these instructions at home:  Stay well hydrated. Drink enough fluid to keep your urine clear or pale yellow.  Try massaging, stretching, and relaxing the affected muscle.  If directed, apply heat to tight or tense muscles as often as told by your health care provider. Use the heat source that your health care provider recommends, such as a moist heat pack or a heating pad. ? Place a towel between your skin and the heat source. ? Leave the heat on for 20-30 minutes. ? Remove the heat if your skin turns bright red. This is especially important if you are unable to feel pain, heat, or cold. You may have a greater risk of getting burned.  If directed, put ice on the affected area. This may help if you are sore or have pain after a cramp or spasm. ? Put ice in a plastic bag. ? Place a towel between your skin and the bag. ? Leavethe ice on for 20 minutes, 2-3 times a day.  Take over-the-counter and prescription medicines only as told by your health care provider.  Pay attention to any changes in your symptoms. Contact a health care provider if:  Your cramps or  spasms get more severe or happen more often.  Your cramps or spasms do not improve over time. This information is not intended to replace advice given to you by your health care provider. Make sure you discuss any questions you have with your health care provider. Document Released: 09/10/2001 Document Revised: 04/22/2015 Document Reviewed: 12/23/2014 Elsevier Interactive Patient Education  2018 [employer].
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
## 2017-07-18 ENCOUNTER — Ambulatory Visit: Payer: BLUE CROSS/BLUE SHIELD | Admitting: Physician Assistant

## 2017-08-01 ENCOUNTER — Other Ambulatory Visit: Payer: Self-pay | Admitting: Physician Assistant

## 2017-08-01 DIAGNOSIS — J45909 Unspecified asthma, uncomplicated: Secondary | ICD-10-CM

## 2017-08-12 ENCOUNTER — Other Ambulatory Visit: Payer: Self-pay

## 2017-08-12 ENCOUNTER — Encounter: Payer: Self-pay | Admitting: Physician Assistant

## 2017-08-12 ENCOUNTER — Ambulatory Visit (INDEPENDENT_AMBULATORY_CARE_PROVIDER_SITE_OTHER): Payer: BLUE CROSS/BLUE SHIELD | Admitting: Physician Assistant

## 2017-08-12 ENCOUNTER — Ambulatory Visit (INDEPENDENT_AMBULATORY_CARE_PROVIDER_SITE_OTHER): Payer: BLUE CROSS/BLUE SHIELD

## 2017-08-12 VITALS — BP 114/74 | HR 77 | Temp 98.3°F | Resp 16 | Ht 68.25 in | Wt 180.8 lb

## 2017-08-12 DIAGNOSIS — I10 Essential (primary) hypertension: Secondary | ICD-10-CM | POA: Diagnosis not present

## 2017-08-12 DIAGNOSIS — R9431 Abnormal electrocardiogram [ECG] [EKG]: Secondary | ICD-10-CM | POA: Diagnosis not present

## 2017-08-12 DIAGNOSIS — R0681 Apnea, not elsewhere classified: Secondary | ICD-10-CM | POA: Diagnosis not present

## 2017-08-12 DIAGNOSIS — F411 Generalized anxiety disorder: Secondary | ICD-10-CM | POA: Diagnosis not present

## 2017-08-12 LAB — POCT CBC
Granulocyte percent: 54.2 %G (ref 37–80)
HCT, POC: 49.1 % (ref 43.5–53.7)
HEMOGLOBIN: 16.3 g/dL (ref 14.1–18.1)
Lymph, poc: 1.9 (ref 0.6–3.4)
MCH: 26.8 pg — AB (ref 27–31.2)
MCHC: 33.3 g/dL (ref 31.8–35.4)
MCV: 80.6 fL (ref 80–97)
MID (CBC): 0.6 (ref 0–0.9)
MPV: 7.6 fL (ref 0–99.8)
POC Granulocyte: 3 (ref 2–6.9)
POC LYMPH %: 35.1 % (ref 10–50)
POC MID %: 10.7 % (ref 0–12)
Platelet Count, POC: 202 10*3/uL (ref 142–424)
RBC: 6.09 M/uL (ref 4.69–6.13)
RDW, POC: 14.8 %
WBC: 5.5 10*3/uL (ref 4.6–10.2)

## 2017-08-12 LAB — POCT URINALYSIS DIP (MANUAL ENTRY)
Bilirubin, UA: NEGATIVE
Glucose, UA: NEGATIVE mg/dL
LEUKOCYTES UA: NEGATIVE
Nitrite, UA: NEGATIVE
RBC UA: NEGATIVE
SPEC GRAV UA: 1.02 (ref 1.010–1.025)
Urobilinogen, UA: 1 E.U./dL
pH, UA: 7 (ref 5.0–8.0)

## 2017-08-12 MED ORDER — ESCITALOPRAM OXALATE 5 MG PO TABS: 2.5000 mg | ORAL_TABLET | Freq: Every day | ORAL | 0 refills | Status: AC

## 2017-08-12 NOTE — Patient Instructions (Addendum)
Cardiology and sleep apnea referrals have been placed.   Continue the amlodipine at its current dose and keep a BP diary as we have discussed.  Come back in two weeks for review.   Start the anxiety medication.  It is a low dose and thus you should have little to no side effects.  I expect some results in 3-6 weeks.     IF you received an x-ray today, you will receive an invoice from [facility]. Please contact [facility] at [phone] with questions or concerns regarding your invoice.   IF you received labwork today, you will receive an invoice from [facility]. Please contact [facility] at [phone] with questions or concerns regarding your invoice.   Our billing staff will not be able to assist you with questions regarding bills from these companies.  You will be contacted with the lab results as soon as they are available. The fastest way to get your results is to activate your My Chart account. Instructions are located on the last page of this paperwork. If you have not heard from [place] regarding the results in 2 weeks, please contact this office.

## 2017-08-12 NOTE — Progress Notes (Signed)
08/12/2017 1:04 PM   DOB: 1970-09-03 / MRN: [id number]  SUBJECTIVE:    Casey [name] is a 47 y.o. male presenting for uncontrolled ambulatory BP measures.  He checks this 1-2 times a week and notes that is has been elevated in the last week.  He does complain of mild chest pain, SOB, orthopnea that waxes and wanes.  He describes the chest pain as a poke, or thump, and this lasts maybe 1 to 2 seconds.  This worrisome and will cause him to check his blood pressure.  He feels that he is getting worse.  He has a history of anxiety and has refused medications in the past.  He wakes up in the morning often very early and has a difficult time going to sleep.  He tells me that very often he would lay in bed "thinking" while trying to go to sleep, sometimes until 3 AM.  He is a known snorer and his wife has witnessed times where it seems that he is gasping or choking while trying to sleep.  Feels that he is not compensated for his quality of work at his job.  His wife drinks and he worries about this.  He worries about his children.  He worries about side effects of medication.  He has a history of asthma and tells me this has been a rough season for him.    He is allergic to other.   He  has a past medical history of Anemia, Asthma, and Hypertension.    He  reports that he has been smoking.  He has been smoking about 0.25 packs per day. He has never used smokeless tobacco. He reports that he drinks alcohol. He reports that he does not use drugs. He  reports that he currently engages in sexual activity. The patient  has a past surgical history that includes No past surgeries.  His family history includes Asthma in his brother; Hypertension in his father; Stroke (age of onset: 18) in his father.  Review of Systems  Constitutional: Negative for chills, diaphoresis and fever.  Eyes: Negative.   Respiratory: Negative for cough, hemoptysis, sputum production, shortness of breath and wheezing.     Gastrointestinal: Negative for abdominal pain, blood in stool, constipation, diarrhea, heartburn, melena, nausea and vomiting.  Genitourinary: Negative for dysuria, flank pain, frequency, hematuria and urgency.  Skin: Negative for rash.  Neurological: Negative for dizziness, sensory change, speech change, focal weakness and headaches.    The problem list and medications were reviewed and updated by myself where necessary and exist elsewhere in the encounter.   OBJECTIVE:  BP 114/74   Pulse 77   Temp 98.3 F (36.8 C) (Oral)   Resp 16   Ht 5' 8.25" (1.734 m)   Wt 180 lb 12.8 oz (82 kg)   SpO2 98%   BMI 27.29 kg/m   Physical Exam  Constitutional: He is oriented to person, place, and time. He appears well-developed. He is active.  Non-toxic appearance. He does not appear ill.  HENT:  Mouth/Throat:    Eyes: Pupils are equal, round, and reactive to light. Conjunctivae and EOM are normal.  Cardiovascular: Normal rate, regular rhythm, S1 normal, S2 normal, normal heart sounds, intact distal pulses and normal pulses. Exam reveals no gallop and no friction rub.  No murmur heard. Pulmonary/Chest: Effort normal. No stridor. No respiratory distress. He has no wheezes. He has no rales.  Abdominal: He exhibits no distension.  Musculoskeletal: Normal range of motion.  He exhibits no edema.  Neurological: He is alert and oriented to person, place, and time. He has normal strength and normal reflexes. He is not disoriented. No cranial nerve deficit or sensory deficit. He exhibits normal muscle tone. Coordination and gait normal.  Skin: Skin is warm and dry. He is not diaphoretic. No pallor.  Psychiatric: He has a normal mood and affect. His behavior is normal.  Nursing note and vitals reviewed.   Results for orders placed or performed in visit on 08/12/17 (from the past 72 hour(s))  POCT CBC     Status: Abnormal   Collection Time: 08/12/17 12:04 PM  Result Value Ref Range   WBC 5.5 4.6 -  10.2 K/uL   Lymph, poc 1.9 0.6 - 3.4   POC LYMPH PERCENT 35.1 10 - 50 %L   MID (cbc) 0.6 0 - 0.9   POC MID % 10.7 0 - 12 %M   POC Granulocyte 3.0 2 - 6.9   Granulocyte percent 54.2 37 - 80 %G   RBC 6.09 4.69 - 6.13 M/uL   Hemoglobin 16.3 14.1 - 18.1 g/dL   HCT, POC 16.1 09.6 - 53.7 %   MCV 80.6 80 - 97 fL   MCH, POC 26.8 (A) 27 - 31.2 pg   MCHC 33.3 31.8 - 35.4 g/dL   RDW, POC 04.5 %   Platelet Count, POC 202 142 - 424 K/uL   MPV 7.6 0 - 99.8 fL  POCT urinalysis dipstick     Status: Abnormal   Collection Time: 08/12/17 12:18 PM  Result Value Ref Range   Color, UA yellow yellow   Clarity, UA clear clear   Glucose, UA negative negative mg/dL   Bilirubin, UA negative negative   Ketones, POC UA trace (5) (A) negative mg/dL   Spec Grav, UA 4.098 1.191 - 1.025   Blood, UA negative negative   pH, UA 7.0 5.0 - 8.0   Protein Ur, POC trace (A) negative mg/dL   Urobilinogen, UA 1.0 0.2 or 1.0 E.U./dL   Nitrite, UA Negative Negative   Leukocytes, UA Negative Negative   Lab Results  Component Value Date   HGBA1C 5.8 (H) 01/09/2017   Lab Results  Component Value Date   CHOL 260 (H) 11/14/2013   HDL 38 (L) 11/14/2013   LDLCALC 168 (H) 11/14/2013   TRIG 270 (H) 11/14/2013   CHOLHDL 6.8 11/14/2013   Lab Results  Component Value Date   CREATININE 1.14 01/09/2017   BUN 8 01/09/2017   NA 141 01/09/2017   K 3.6 01/09/2017   CL 102 01/09/2017   CO2 19 (L) 01/09/2017     Visual Acuity Screening   Right eye Left eye Both eyes  Without correction:  With correction:         Dg Chest 2 View  Result Date: 08/12/2017 CLINICAL DATA:  Uncontrolled hypertension EXAM: CHEST - 2 VIEW COMPARISON:  November 10, 2016 FINDINGS: The heart size and mediastinal contours are within normal limits. Both lungs are clear. The visualized skeletal structures are unremarkable. IMPRESSION: No active cardiopulmonary disease. Electronically Signed   By: Sherian Rein M.D.   On: 08/12/2017  12:09     ASSESSMENT AND PLAN:  Raford was seen today for hypertension.  Diagnoses and all orders for this visit:  Well-controlled hypertension: Work-up is negative here.  He has a long history of abnormal EKG and would benefit from seeing cardiology.  I have referred him to [place] [facility] for I  think he will at least receive a echocardiogram to evaluate the left ventricle.  Avoiding beta blockade given history of asthma. nFor now I have advised that he continue his Norvasc as prescribed at 5 mg daily -     EKG 12-Lead -     DG Chest 2 View; Future -     POCT CBC -     POCT urinalysis dipstick -     Visual acuity screening  Generalized anxiety disorder: I think he has a significant pathology here.  Started him on a low-dose of Lexapro and we will see him back in about 2 weeks to review his symptoms as well as his blood pressure diary.  Nonspecific abnormal electrocardiogram (ECG) (EKG) -     Ambulatory referral to Cardiology  Witnessed episode of apnea -     Ambulatory referral to Neurology  Other orders -     escitalopram (LEXAPRO) 5 MG tablet; Take 0.5 tablets (2.5 mg total) by mouth daily.    The patient is advised to call or return to clinic if he does not see an improvement in symptoms, or to seek the care of the closest emergency department if he worsens with the above plan.   Deliah Boston, MHS, PA-C Primary Care at [facility] 08/12/2017 1:04 PM

## 2017-08-12 NOTE — Progress Notes (Signed)
    08/12/2017 11:39 AM   DOB: 11/05/1970 / MRN: [id number]  SUBJECTIVE:  Casey [name] is a 47 y.o. male presenting for   He is allergic to other.   He  has a past medical history of Anemia, Asthma, and Hypertension.    He  reports that he has been smoking.  He has been smoking about 0.25 packs per day. He has never used smokeless tobacco. He reports that he drinks alcohol. He reports that he does not use drugs. He  reports that he currently engages in sexual activity. The patient  has a past surgical history that includes No past surgeries.  His family history includes Asthma in his brother; Hypertension in his father; Stroke (age of onset: 33) in his father.  ROS  The problem list and medications were reviewed and updated by myself where necessary and exist elsewhere in the encounter.   OBJECTIVE:  BP 138/86 (BP Location: Right Arm)   Pulse 77   Temp 98.3 F (36.8 C) (Oral)   Resp 16   Ht 5' 8.25" (1.734 m)   Wt 180 lb 12.8 oz (82 kg)   SpO2 98%   BMI 27.29 kg/m   Physical Exam  No results found for this or any previous visit (from the past 72 hour(s)).  No results found.  ASSESSMENT AND PLAN:  There are no diagnoses linked to this encounter.  The patient is advised to call or return to clinic if he does not see an improvement in symptoms, or to seek the care of the closest emergency department if he worsens with the above plan.   Deliah Boston, MHS, PA-C Primary Care at [facility] 08/12/2017 11:39 AM

## 2017-08-16 ENCOUNTER — Other Ambulatory Visit: Payer: Self-pay | Admitting: Physician Assistant

## 2017-08-16 DIAGNOSIS — J45909 Unspecified asthma, uncomplicated: Secondary | ICD-10-CM

## 2017-08-22 ENCOUNTER — Ambulatory Visit: Payer: BLUE CROSS/BLUE SHIELD | Admitting: Physician Assistant

## 2017-09-08 ENCOUNTER — Other Ambulatory Visit: Payer: Self-pay | Admitting: Physician Assistant

## 2017-09-08 DIAGNOSIS — J45909 Unspecified asthma, uncomplicated: Secondary | ICD-10-CM

## 2017-09-11 ENCOUNTER — Telehealth: Payer: Self-pay | Admitting: Physician Assistant

## 2017-09-11 NOTE — Telephone Encounter (Signed)
Pt cancelled his new patient appointment with [facility] and has not rescheduled.  FYI

## 2017-09-27 ENCOUNTER — Other Ambulatory Visit: Payer: Self-pay | Admitting: Physician Assistant

## 2017-09-27 DIAGNOSIS — J45909 Unspecified asthma, uncomplicated: Secondary | ICD-10-CM

## 2017-09-27 NOTE — Telephone Encounter (Signed)
Refill request for ventolin hfa 108 mcg/act inhaler #1 with 0 refills approved. Dgaddy, CMA

## 2017-10-16 ENCOUNTER — Other Ambulatory Visit: Payer: Self-pay | Admitting: Physician Assistant

## 2017-10-16 DIAGNOSIS — J45909 Unspecified asthma, uncomplicated: Secondary | ICD-10-CM

## 2017-10-25 ENCOUNTER — Institutional Professional Consult (permissible substitution): Payer: BLUE CROSS/BLUE SHIELD | Admitting: Neurology

## 2017-11-03 ENCOUNTER — Other Ambulatory Visit: Payer: Self-pay | Admitting: Physician Assistant

## 2017-11-03 DIAGNOSIS — J45909 Unspecified asthma, uncomplicated: Secondary | ICD-10-CM

## 2017-11-04 ENCOUNTER — Other Ambulatory Visit: Payer: Self-pay | Admitting: Physician Assistant

## 2017-11-04 DIAGNOSIS — J45909 Unspecified asthma, uncomplicated: Secondary | ICD-10-CM

## 2017-11-06 ENCOUNTER — Telehealth: Payer: Self-pay | Admitting: Physician Assistant

## 2017-11-06 NOTE — Telephone Encounter (Signed)
Copied from CRM [id number]. Topic: [place] Communication - See Telephone Encounter >> Nov 06, 2017  1:35 PM Waymon AmatoBurton, Donna F wrote: Pt is needing a refill on his ventolin  [facility] [address]   Best number [phone]

## 2017-11-06 NOTE — Telephone Encounter (Signed)
Mediation filled on 11/06/17

## 2017-11-07 ENCOUNTER — Other Ambulatory Visit: Payer: Self-pay | Admitting: Physician Assistant

## 2017-11-07 DIAGNOSIS — J45909 Unspecified asthma, uncomplicated: Secondary | ICD-10-CM

## 2017-11-28 ENCOUNTER — Other Ambulatory Visit: Payer: Self-pay | Admitting: Physician Assistant

## 2017-11-28 DIAGNOSIS — J45909 Unspecified asthma, uncomplicated: Secondary | ICD-10-CM

## 2017-11-28 NOTE — Telephone Encounter (Signed)
Ventolin HFA Inh. refill request.  LRF 11/06/17; # 18 g. / RF 0 LOV 06/20/17 w/ PCP; 08/12/17 w/ M. Clark  PCP S. [name] Was advised to f/u with PCP in 4 weeks (from 06/20/17)  Will give one refill and advise to sched. office appt.; per protocol.

## 2017-12-21 ENCOUNTER — Other Ambulatory Visit: Payer: Self-pay | Admitting: Physician Assistant

## 2017-12-21 DIAGNOSIS — J45909 Unspecified asthma, uncomplicated: Secondary | ICD-10-CM

## 2017-12-21 NOTE — Telephone Encounter (Signed)
Pt called back and declined to make an appt for refills.  Pt states he was seen in May, has been on this maintenance med due to his asthma for years, and just needs a refill. Ventolin inhaler  [id number] [place] - [address] [phone] (Phone) [phone] (Fax)

## 2017-12-21 NOTE — Telephone Encounter (Signed)
Attempted to call patient to schedule an appointment for refills. No answer, left voice mail for him to call and schedule.   LOV  08/12/17 LR for Ventolin inhaler  Was 11/28/17. Former pt of Benny LennertSarah [name].

## 2017-12-22 NOTE — Telephone Encounter (Signed)
Pt was seen on 08/12/17 for HTN, and was given Lexapro, as there was some discussion as to what is going on in his personal life. He was advised to f/u in 2 weeks, after being put on Lexapro, in which he has not done. Per ofc note on 08/12/17 there was no mention of asthma flare up at that time. He has not been evaluated for asthma since 12/06/2016.

## 2017-12-25 ENCOUNTER — Other Ambulatory Visit: Payer: Self-pay

## 2017-12-25 ENCOUNTER — Encounter: Payer: Self-pay | Admitting: Family Medicine

## 2017-12-25 ENCOUNTER — Ambulatory Visit: Payer: BLUE CROSS/BLUE SHIELD | Admitting: Family Medicine

## 2017-12-25 VITALS — BP 145/86 | HR 78 | Temp 98.0°F | Resp 17 | Ht 68.25 in | Wt 189.8 lb

## 2017-12-25 DIAGNOSIS — L219 Seborrheic dermatitis, unspecified: Secondary | ICD-10-CM | POA: Diagnosis not present

## 2017-12-25 DIAGNOSIS — J45909 Unspecified asthma, uncomplicated: Secondary | ICD-10-CM

## 2017-12-25 LAB — POCT SKIN KOH: SKIN KOH, POC: NEGATIVE

## 2017-12-25 MED ORDER — ALBUTEROL SULFATE (2.5 MG/3ML) 0.083% IN NEBU: INHALATION_SOLUTION | RESPIRATORY_TRACT | 3 refills | Status: AC

## 2017-12-25 MED ORDER — BECLOMETHASONE DIPROP HFA 40 MCG/ACT IN AERB: 2.0000 | INHALATION_SPRAY | Freq: Two times a day (BID) | RESPIRATORY_TRACT | 5 refills | Status: AC

## 2017-12-25 MED ORDER — FLUOCINONIDE 0.05 % EX SOLN: 1.0000 "application " | Freq: Two times a day (BID) | CUTANEOUS | 1 refills | Status: AC

## 2017-12-25 MED ORDER — ALBUTEROL SULFATE HFA 108 (90 BASE) MCG/ACT IN AERS: INHALATION_SPRAY | RESPIRATORY_TRACT | 3 refills | Status: DC

## 2017-12-25 MED ORDER — MONTELUKAST SODIUM 10 MG PO TABS: 10.0000 mg | ORAL_TABLET | Freq: Every day | ORAL | 3 refills | Status: AC

## 2017-12-25 NOTE — Patient Instructions (Addendum)
  Continue using your albuterol only when needed  Use the Qvar inhaler 2 inhalations twice daily for chronic treatment of asthma  Take the Singulair one at bedtime for asthma  Use the fluocinonide solution all your scalp a few drops rub daily twice daily for the rash  Return if problems.  Otherwise you should get your lungs recheck in about 3 or 4 months.   If you have lab work done today you will be contacted with your lab results within the next 2 weeks.  If you have not heard from us then please contact us. The fastest way to get your results is to register for My Chart.   IF you received an x-ray today, you will receive an invoice from [facility]. Please contact [facility] at [phone] with questions or concerns regarding your invoice.   IF you received labwork today, you will receive an invoice from [facility]. Please contact [facility] at [phone] with questions or concerns regarding your invoice.   Our billing staff will not be able to assist you with questions regarding bills from these companies.  You will be contacted with the lab results as soon as they are available. The fastest way to get your results is to activate your My Chart account. Instructions are located on the last page of this paperwork. If you have not heard from us regarding the results in 2 weeks, please contact this office.

## 2017-12-25 NOTE — Progress Notes (Signed)
Patient ID: Casey [name], male    DOB: [birth year]  Age: 47 y.o. MRN: [id number]  Chief Complaint  Patient presents with  . asthma follow up    asthma flare 2 neb treatments before coming to office, missed work due to not having inhaler to help with breathing to be able to work  . flaky scalp spreading x 5 years and given several rx's for i  . Medication Refill    albuterol solution for neb, albuterol inhaler    Subjective:   Patient here with 2 main problems.  His scalp is been itching him.  He has been to dermatologists in the past  He has asthma and is out of his inhaler.  Current allergies, medications, problem list, past/family and social histories reviewed.  Objective:  BP (!) 145/86 (BP Location: Right Arm, Patient Position: Sitting, Cuff Size: Large)   Pulse 78   Temp 98 F (36.7 C) (Oral)   Resp 17   Ht 5' 8.25" (1.734 m)   Wt 189 lb 12.8 oz (86.1 kg)   SpO2 96%   BMI 28.65 kg/m   No major distress.  He has soft generalized wheeze.  Heart regular without murmurs.  Throat clear.  Neck supple without nodes.  He has a patch of seborrheic dermatitis about 3 or 4 inches in diameter on the left scalp.  Skin scraping was done and did not show any fungus.  Assessment & Plan:   Assessment: 1. Seborrhea   2. Uncomplicated asthma, unspecified asthma severity, unspecified whether persistent       Plan: See instructions  Orders Placed This Encounter  Procedures  . POCT Skin KOH    Meds ordered this encounter  Medications  . albuterol (PROVENTIL) (2.5 MG/3ML) 0.083% nebulizer solution    Sig: USE 1 VIAL VIA NEBULIZER Q 6 H PRF WHEEZING OR SOB    Dispense:  75 mL    Refill:  3  . albuterol (PROVENTIL HFA;VENTOLIN HFA) 108 (90 Base) MCG/ACT inhaler    Sig: Use 1 to 2 puffs every 6 hours as needed for wheezing or shortness of breath    Dispense:  18 g    Refill:  3  . beclomethasone (QVAR) 40 MCG/ACT inhaler    Sig: Inhale 2 puffs into the lungs 2 (two) times  daily.    Dispense:  1 Inhaler    Refill:  5  . fluocinonide (LIDEX) 0.05 % external solution    Sig: Apply 1 application topically 2 (two) times daily.    Dispense:  60 mL    Refill:  1  . montelukast (SINGULAIR) 10 MG tablet    Sig: Take 1 tablet (10 mg total) by mouth at bedtime.    Dispense:  30 tablet    Refill:  3         Patient Instructions    Continue using your albuterol only when needed  Use the Qvar inhaler 2 inhalations twice daily for chronic treatment of asthma  Take the Singulair one at bedtime for asthma  Use the fluocinonide solution all your scalp a few drops rub daily twice daily for the rash  Return if problems.  Otherwise you should get your lungs recheck in about 3 or 4 months.   If you have lab work done today you will be contacted with your lab results within the next 2 weeks.  If you have not heard from [place] then please contact us. The fastest way to get your results is to  register for My Chart.   IF you received an x-ray today, you will receive an invoice from [facility]. Please contact [facility] at [phone] with questions or concerns regarding your invoice.   IF you received labwork today, you will receive an invoice from [facility]. Please contact [facility] at [phone] with questions or concerns regarding your invoice.   Our billing staff will not be able to assist you with questions regarding bills from these companies.  You will be contacted with the lab results as soon as they are available. The fastest way to get your results is to activate your My Chart account. Instructions are located on the last page of this paperwork. If you have not heard from us regarding the results in 2 weeks, please contact this office.        Return if symptoms worsen or fail to improve.   Janace Hoardavid Hopper, MD 12/25/2017

## 2018-01-02 ENCOUNTER — Other Ambulatory Visit: Payer: Self-pay | Admitting: Physician Assistant

## 2018-01-02 DIAGNOSIS — I10 Essential (primary) hypertension: Secondary | ICD-10-CM

## 2018-01-04 NOTE — Telephone Encounter (Signed)
Requested medication (s) are due for refill today:   Yes  Requested medication (s) are on the active medication list:   Yes  Future visit scheduled:   No  Was pt of M. Clark and S. [name].   Not established with a new provider reason returned.   Last ordered: 1 year ago   Requested Prescriptions  Pending Prescriptions Disp Refills   amLODipine (NORVASC) 5 MG tablet [Pharmacy Med Name: AMLODIPINE BESYLATE 5MG  TABLETS] 90 tablet 0    Sig: TAKE 1 TABLET(5 MG) BY MOUTH DAILY     Cardiovascular:  Calcium Channel Blockers Failed - 01/04/2018  9:24 AM      Failed - Last BP in normal range    BP Readings from Last 1 Encounters:  12/25/17 (!) 145/86         Passed - Valid encounter within last 6 months    Recent Outpatient Visits          1 week ago Seborrhea   Primary Care at [facility], Sandria Bales, MD   4 months ago Well-controlled hypertension   [facility] Care at [facility], Marolyn Hammock, PA-C   5 months ago Muscle cramps   Primary Care at [facility], Zoe A, MD   6 months ago Umbilical hernia without obstruction and without gangrene   [facility] at [facility], Dema Severin, PA-C   12 months ago Essential hypertension   Primary Care at [facility], Marolyn Hammock, PA-C

## 2018-01-04 NOTE — Telephone Encounter (Signed)
Pt called stating he will be out of meds on 10/4 - Please advise.  [id number] - [place], [place] - [address]

## 2018-01-08 ENCOUNTER — Telehealth: Payer: Self-pay | Admitting: Physician Assistant

## 2018-01-08 NOTE — Telephone Encounter (Signed)
Copied from CRM [id number]. Topic: General - Other >> Jan 08, 2018  9:56 AM Jaquita Rector A wrote: Reason for CRM: Patient called to request order for a portable Nebulizer machine said that he is going on vacation and due to his Asthma issues this machine would help also can be used at word when needed. Stated that he should be leaving on 01/17/18. Need a call back at Ph# [phone]

## 2018-01-11 ENCOUNTER — Telehealth: Payer: Self-pay | Admitting: Family Medicine

## 2018-01-11 NOTE — Telephone Encounter (Signed)
Message sent to Dr. Creta Levin.   Pt has seen [name] and Clark Please advise.

## 2018-01-11 NOTE — Telephone Encounter (Signed)
Copied from CRM [id number]. Topic: General - Other >> Jan 11, 2018  3:44 PM Marylen Ponto wrote: Reason for CRM: Pt called in to check the status of his request for a portable Nebulizer. Pt requests call back. Cb# [phone]

## 2018-01-15 NOTE — Telephone Encounter (Signed)
Patient is calling back for an update. He said that he would like someone from the office to call him back today. [phone]

## 2018-01-16 NOTE — Telephone Encounter (Signed)
I called pt.  Pt stated that his ins company states that they will cover him having a nebulizer. I stated to the pt that he will need to be seen by a provider. Pt was upset and hung up.

## 2018-01-16 NOTE — Telephone Encounter (Signed)
Patient called back to check on status of Nebulizer. Saw Dr Alwyn Ren few weeks ago. I spoke with Delores who said that she will look into issues and give patient a call back today. Patient was informed. Ph# [phone]

## 2018-03-17 ENCOUNTER — Emergency Department (HOSPITAL_COMMUNITY): Payer: BLUE CROSS/BLUE SHIELD

## 2018-03-17 ENCOUNTER — Encounter (HOSPITAL_COMMUNITY): Payer: Self-pay

## 2018-03-17 ENCOUNTER — Emergency Department (HOSPITAL_COMMUNITY)
Admission: EM | Admit: 2018-03-17 | Discharge: 2018-03-17 | Disposition: A | Discharge status: Home / Self Care | Payer: BLUE CROSS/BLUE SHIELD | Attending: Emergency Medicine | Admitting: Emergency Medicine

## 2018-03-17 ENCOUNTER — Other Ambulatory Visit: Payer: Self-pay

## 2018-03-17 DIAGNOSIS — F17210 Nicotine dependence, cigarettes, uncomplicated: Secondary | ICD-10-CM | POA: Diagnosis not present

## 2018-03-17 DIAGNOSIS — J45909 Unspecified asthma, uncomplicated: Secondary | ICD-10-CM | POA: Insufficient documentation

## 2018-03-17 DIAGNOSIS — F411 Generalized anxiety disorder: Secondary | ICD-10-CM | POA: Diagnosis not present

## 2018-03-17 DIAGNOSIS — I10 Essential (primary) hypertension: Secondary | ICD-10-CM | POA: Diagnosis not present

## 2018-03-17 DIAGNOSIS — R42 Dizziness and giddiness: Secondary | ICD-10-CM | POA: Diagnosis not present

## 2018-03-17 LAB — BASIC METABOLIC PANEL
Anion gap: 10 (ref 5–15)
BUN: 13 mg/dL (ref 6–20)
CO2: 22 mmol/L (ref 22–32)
Calcium: 8.7 mg/dL — ABNORMAL LOW (ref 8.9–10.3)
Chloride: 107 mmol/L (ref 98–111)
Creatinine, Ser: 1.02 mg/dL (ref 0.61–1.24)
GFR calc Af Amer: >60 mL/min (ref >60)
GFR calc non Af Amer: >60 mL/min (ref >60)
Glucose, Bld: 118 mg/dL — ABNORMAL HIGH (ref 70–99)
Potassium: 3.7 mmol/L (ref 3.5–5.1)
Sodium: 139 mmol/L (ref 135–145)

## 2018-03-17 LAB — CBC WITH DIFFERENTIAL/PLATELET
Abs Immature Granulocytes: 0.07 10*3/uL (ref 0.00–0.07)
Basophils Absolute: 0.1 10*3/uL (ref 0.0–0.1)
Basophils Relative: 1 %
Eosinophils Absolute: 0.1 10*3/uL (ref 0.0–0.5)
Eosinophils Relative: 1 %
HCT: 47 % (ref 39.0–52.0)
Hemoglobin: 15.3 g/dL (ref 13.0–17.0)
Immature Granulocytes: 1 %
Lymphocytes Relative: 46 %
Lymphs Abs: 4.8 10*3/uL — ABNORMAL HIGH (ref 0.7–4.0)
MCH: 27.2 pg (ref 26.0–34.0)
MCHC: 32.6 g/dL (ref 30.0–36.0)
MCV: 83.5 fL (ref 80.0–100.0)
Monocytes Absolute: 0.8 10*3/uL (ref 0.1–1.0)
Monocytes Relative: 8 %
Neutro Abs: 4.4 10*3/uL (ref 1.7–7.7)
Neutrophils Relative %: 43 %
Platelets: 221 10*3/uL (ref 150–400)
RBC: 5.63 MIL/uL (ref 4.22–5.81)
RDW: 14.9 % (ref 11.5–15.5)
WBC: 10.2 10*3/uL (ref 4.0–10.5)
nRBC: 0 % (ref 0.0–0.2)

## 2018-03-17 MED ORDER — MECLIZINE HCL 25 MG PO TABS: 25.0000 mg | ORAL_TABLET | Freq: Three times a day (TID) | ORAL | 0 refills | Status: AC | PRN

## 2018-03-17 MED ORDER — MECLIZINE HCL 25 MG PO TABS
25.0000 mg | ORAL_TABLET | Freq: Once | ORAL | Status: AC
  Administered 2018-03-17: 25 mg via ORAL
  Filled 2018-03-17: qty 1

## 2018-03-17 NOTE — ED Notes (Signed)
Family at bedside. 

## 2018-03-17 NOTE — ED Provider Notes (Signed)
[facility]-EMERGENCY DEPT Provider Note   CSN: [id number] Arrival date & time: 03/17/18  1258     History   Chief Complaint Chief Complaint  Patient presents with  . Dizziness  . Headache  . Anxiety    HPI Casey [name] is a 47 y.o. male with history of asthma, anemia, hypertension, generalized anxiety disorder presents for evaluation of intermittent, progressively worsening dizziness.  He reports that he has been experiencing dizziness almost on a daily basis for years.  This primarily occurs with certain movements or turning his head a certain way.  When he awoke this morning at around 7:30 AM he noticed that the dizziness was more persistent.  He describes the symptoms as like the room is spinning and like he feels unsteady ambulating.  He also endorses lightheadedness, photophobia, blurred vision, and intermittent numbness/tingling of the fingertips.  He also notes tinnitus though this primarily occurred over the last few weeks and has since resolved.  He is also had nasal congestion and bilateral ear pain for the last 2 weeks or so.  He endorses dull throbbing headache localized behind the eyes and to the occipital region.  Reports this headache is similar to headaches he has experienced in the past.  He feels fatigued.  He went to his primary care physician who sent him here for further evaluation.  He has not tried anything for his symptoms.  He has asthma and notes mild chest tightness but denies chest pain, nausea, vomiting, or abdominal pain.  No slurred speech or facial droop.  The history is provided by the patient.    Past Medical History:  Diagnosis Date  . Anemia   . Asthma    diagnosed in childhood; age 7.  No hospitalizations.    . Hypertension    onset age 76.    Patient Active Problem List   Diagnosis Date Noted  . Rash, skin 03/15/2016  . Other seasonal allergic rhinitis 01/02/2014  . External hemorrhoids 01/02/2014  . Insomnia  01/02/2014  . Essential hypertension 11/14/2013  . Palpitations 11/14/2013  . Generalized anxiety disorder 11/14/2013  . Asthma 10/24/2012    Past Surgical History:  Procedure Laterality Date  . NO PAST SURGERIES          Home Medications    Prior to Admission medications   Medication Sig Start Date End Date Taking? Authorizing Provider  albuterol (PROVENTIL HFA;VENTOLIN HFA) 108 (90 Base) MCG/ACT inhaler Use 1 to 2 puffs every 6 hours as needed for wheezing or shortness of breath 12/25/17   Peyton Najjar, MD  albuterol (PROVENTIL) (2.5 MG/3ML) 0.083% nebulizer solution USE 1 VIAL VIA NEBULIZER Q 6 H PRF WHEEZING OR SOB 12/25/17   Peyton Najjar, MD  amLODipine (NORVASC) 5 MG tablet TAKE 1 TABLET(5 MG) BY MOUTH DAILY 01/04/18   Myles Lipps, MD  Aspirin-Salicylamide-Caffeine (BC HEADACHE POWDER PO) Take 1 Package by mouth daily as needed (for chest pain).    [provider]  baclofen (LIORESAL) 10 MG tablet Take 1 tablet (10 mg total) by mouth 3 (three) times daily. Patient not taking: Reported on 12/25/2017 07/13/17   Doristine Bosworth, MD  beclomethasone (QVAR) 40 MCG/ACT inhaler Inhale 2 puffs into the lungs 2 (two) times daily. 12/25/17   Peyton Najjar, MD  Clobetasol Propionate 0.05 % shampoo Use twice daily in the shower. Patient not taking: Reported on 08/12/2017 01/09/17   Ofilia Neas, PA-C  escitalopram (LEXAPRO) 5 MG tablet Take  0.5 tablets (2.5 mg total) by mouth daily. Patient not taking: Reported on 12/25/2017 08/12/17   Ofilia Neas, PA-C  fexofenadine (ALLEGRA) 60 MG tablet Take 60 mg by mouth daily as needed for allergies or rhinitis.    [provider]  fluocinonide (LIDEX) 0.05 % external solution Apply 1 application topically 2 (two) times daily. 12/25/17   Peyton Najjar, MD  fluticasone (FLOVENT HFA) 220 MCG/ACT inhaler Inhale 2 puffs into the lungs 2 (two) times daily. Patient not taking: Reported on 12/25/2017 06/20/17   Morrell Riddle,  PA-C  meclizine (ANTIVERT) 25 MG tablet Take 1 tablet (25 mg total) by mouth 3 (three) times daily as needed for dizziness. 03/17/18   Sabrena Gavitt A, PA-C  meloxicam (MOBIC) 7.5 MG tablet Take 1 tablet (7.5 mg total) by mouth daily. Patient not taking: Reported on 08/12/2017 07/13/17   Doristine Bosworth, MD  montelukast (SINGULAIR) 10 MG tablet Take 1 tablet (10 mg total) by mouth at bedtime. 12/25/17   Peyton Najjar, MD  triamcinolone cream (KENALOG) 0.1 % Apply 1 application topically 2 (two) times daily. Patient not taking: Reported on 12/25/2017 01/09/17   Ofilia Neas, PA-C    Family History Family History  Problem Relation Age of Onset  . Hypertension Father   . Stroke Father 75       CVA; recurrent CVA at time of death  . Asthma Brother     Social History Social History   Tobacco Use  . Smoking status: Current Every Day Smoker    Packs/day: 0.25    Types: Cigarettes    Last attempt to quit: 05/05/2012    Years since quitting: 5.8  . Smokeless tobacco: Never Used  . Tobacco comment: working to cut back  Substance Use Topics  . Alcohol use: Yes    Alcohol/week: 0.0 - 4.0 standard drinks    Comment: most weeks doesn't drink at all  . Drug use: No     Allergies   Other   Review of Systems Review of Systems  Constitutional: Positive for fatigue. Negative for chills and fever.  HENT: Positive for ear pain. Negative for sore throat.   Eyes: Positive for photophobia and visual disturbance.  Respiratory: Positive for chest tightness. Negative for shortness of breath.   Cardiovascular: Negative for chest pain.  Gastrointestinal: Negative for abdominal pain, nausea and vomiting.  Neurological: Positive for dizziness, light-headedness and headaches. Negative for facial asymmetry and weakness.  All other systems reviewed and are negative.    Physical Exam Updated Vital Signs BP (!) 152/86   Pulse 82   Temp 98.7 F (37.1 C) (Oral)   Resp 16   Ht 5\' 9"  (1.753 m)    Wt 85.7 kg   SpO2 100%   BMI 27.91 kg/m   Physical Exam Vitals signs and nursing note reviewed.  Constitutional:      General: He is not in acute distress.    Appearance: He is well-developed.  HENT:     Head: Normocephalic and atraumatic.     Right Ear: A middle ear effusion is present.     Left Ear: A middle ear effusion is present.     Nose: Mucosal edema present.     Right Sinus: No maxillary sinus tenderness or frontal sinus tenderness.     Left Sinus: No maxillary sinus tenderness or frontal sinus tenderness.  Eyes:     General:        Right eye: No discharge.  Left eye: No discharge.     Extraocular Movements: Extraocular movements intact.     Right eye: Nystagmus present.     Left eye: Nystagmus present.     Conjunctiva/sclera: Conjunctivae normal.     Pupils: Pupils are equal, round, and reactive to light.  Neck:     Musculoskeletal: Normal range of motion and neck supple.     Vascular: No JVD.     Trachea: No tracheal deviation.  Cardiovascular:     Rate and Rhythm: Normal rate.  Pulmonary:     Effort: Pulmonary effort is normal.  Abdominal:     General: Bowel sounds are normal. There is no distension.     Palpations: Abdomen is soft.     Tenderness: There is no abdominal tenderness.  Skin:    General: Skin is warm and dry.     Findings: No erythema.  Neurological:     Mental Status: He is alert and oriented to person, place, and time.     GCS: GCS eye subscore is 4. GCS verbal subscore is 5. GCS motor subscore is 6.     Cranial Nerves: No cranial nerve deficit.     Sensory: No sensory deficit.     Coordination: Romberg sign negative. Coordination normal.     Gait: Gait normal.     Comments: Mental Status:  Alert, thought content appropriate, able to give a coherent history. Speech fluent without evidence of aphasia. Able to follow 2 step commands without difficulty.  Cranial Nerves:  II:  Peripheral visual fields grossly normal, pupils equal, round,  reactive to light III,IV, VI: ptosis not present, extra-ocular motions intact bilaterally  V,VII: smile symmetric, facial light touch sensation equal VIII: hearing grossly normal to voice  X: uvula elevates symmetrically  XI: bilateral shoulder shrug symmetric and strong XII: midline tongue extension without fassiculations Motor:  Normal tone. 5/5 strength of BUE and BLE major muscle groups including strong and equal grip strength and dorsiflexion/plantar flexion Sensory: light touch normal in all extremities. Cerebellar: normal finger-to-nose with bilateral upper extremities  Gait: normal gait and balance. Able to walk on toes and heels with ease.  No pronator drift. Horizontal nystagmus noted. No skew deviation on test of skew  Psychiatric:        Behavior: Behavior normal.      ED Treatments / Results  Labs (all labs ordered are listed, but only abnormal results are displayed) Labs Reviewed  BASIC METABOLIC PANEL - Abnormal; Notable for the following components:      Result Value   Glucose, Bld 118 (*)    Calcium 8.7 (*)    All other components within normal limits  CBC WITH DIFFERENTIAL/PLATELET - Abnormal; Notable for the following components:   Lymphs Abs 4.8 (*)    All other components within normal limits    EKG None  Radiology Ct Head Wo Contrast  Result Date: 03/17/2018 CLINICAL DATA:  Episodic vertigo. EXAM: CT HEAD WITHOUT CONTRAST TECHNIQUE: Contiguous axial images were obtained from the base of the skull through the vertex without intravenous contrast. COMPARISON:  None. FINDINGS: Brain: No evidence of acute infarction, hemorrhage, hydrocephalus, extra-axial collection or mass lesion/mass effect. Vascular: No hyperdense vessel or unexpected calcification. Skull: Normal. Negative for fracture or focal lesion. Sinuses/Orbits: No acute finding. Other: None. IMPRESSION: No acute intracranial abnormalities. No cause for the patient's vertigo identified.  Electronically Signed   By: Gerome Samavid  Williams III M.D   On: 03/17/2018 16:54    Procedures Procedures (including critical  care time)  Medications Ordered in ED Medications  meclizine (ANTIVERT) tablet 25 mg (25 mg Oral Given 03/17/18 1644)     Initial Impression / Assessment and Plan / ED Course  I have reviewed the triage vital signs and the nursing notes.  Pertinent labs & imaging results that were available during my care of the patient were reviewed by me and considered in my medical decision making (see chart for details).     Patient sent from PCPs office for evaluation of dizziness.  He reports that he has been experiencing dizziness for several years almost on a daily basis but it worsened today.  It is episodic, more constant today than usual but does depend on positioning and rapid head movements.  Patient is afebrile, mildly hypertensive though this appears to be his baseline per his report.  He is nontoxic in appearance.  His neuro exam is suggestive of BPPV/peripheral vertigo with no focal neurologic deficits otherwise.  Labs reviewed by me show no leukocytosis, no anemia, no metabolic derangements or renal insufficiency.  Head CT shows no acute intracranial abnormalities.  No evidence of mass, CVA, ICH, SAH, or skull fracture.  He was given meclizine with significant improvement in his dizziness.  No further emergent work-up required at this time.  Recommend follow-up with PCP for reevaluation of symptoms.  He does have findings suggestive of URI, encouraged continued use of over-the-counter medications for symptomatic relief.  Discussed red ED return precautions patient and patient's wife verbalized understanding of and agreement with plan and patient is safe for discharge home at this time.   Final Clinical Impressions(s) / ED Diagnoses   Final diagnoses:  Dizziness    ED Discharge Orders         Ordered    meclizine (ANTIVERT) 25 MG tablet  3 times daily PRN     03/17/18  1752           Jeanie Sewer, PA-C 03/17/18 1836    Tilden Fossa, MD 03/18/18 2105

## 2018-03-17 NOTE — ED Triage Notes (Signed)
Patient went to [facility] today for c/o dizziness, anxiety, and headache x 2 days. Patient states he is anxious because he has been having dizziness. Papers sent stated that the patient had an inner ear infection and a non productive cough a couple of weeks ago.

## 2018-03-17 NOTE — Discharge Instructions (Signed)
Start taking meclizine as prescribed up to 3 times daily as needed for dizziness.  This medication may make you drowsy so do not drive, drink alcohol, or operate heavy machinery while taking this medication.  Drink plenty of water and get plenty of rest.  Follow-up with your primary care physician for reevaluation of your symptoms.  Your work-up today was reassuring that your dizziness is probably a sign of peripheral vertigo or BPPV.  Your primary doctor can also prescribe physical therapy which may be helpful.  Return to the emergency department immediately if any concerning signs or symptoms develop such as persistent dizziness, vomiting, weakness, slurred speech, or facial droop.

## 2018-03-25 ENCOUNTER — Other Ambulatory Visit: Payer: Self-pay | Admitting: Family Medicine

## 2018-03-25 DIAGNOSIS — I10 Essential (primary) hypertension: Secondary | ICD-10-CM

## 2018-06-20 ENCOUNTER — Other Ambulatory Visit: Payer: Self-pay | Admitting: Family Medicine

## 2018-06-20 DIAGNOSIS — J45909 Unspecified asthma, uncomplicated: Secondary | ICD-10-CM

## 2018-06-20 NOTE — Telephone Encounter (Signed)
Requested Prescriptions  Pending Prescriptions Disp Refills  . VENTOLIN HFA 108 (90 Base) MCG/ACT inhaler [Pharmacy Med Name: VENTOLIN HFA INH W/DOS CTR 200PUFFS] 18 g 3    Sig: INHALE 1 TO 2 PUFFS BY MOUTH EVERY 6 HOURS AS NEEDED FOR WHEEZING OR SHORTNESS OF BREATH     Pulmonology:  Beta Agonists Failed - 06/20/2018  3:25 AM      Failed - One inhaler should last at least one month. If the patient is requesting refills earlier, contact the patient to check for uncontrolled symptoms.      Passed - Valid encounter within last 12 months    Recent Outpatient Visits          5 months ago Seborrhea   Primary Care at [facility], Sandria Bales, MD   10 months ago Well-controlled hypertension   Primary Care at [facility], Marolyn Hammock, PA-C   11 months ago Muscle cramps   Primary Care at [facility], Manus Rudd, MD   1 year ago Umbilical hernia without obstruction and without gangrene   [facility] at [facility], Dema Severin, PA-C   1 year ago Essential hypertension   Primary Care at [facility], Marolyn Hammock, PA-C

## 2018-06-22 ENCOUNTER — Other Ambulatory Visit: Payer: Self-pay | Admitting: Family Medicine

## 2018-06-22 DIAGNOSIS — I10 Essential (primary) hypertension: Secondary | ICD-10-CM

## 2018-06-22 NOTE — Telephone Encounter (Signed)
Requested medication (s) are due for refill today: yes  Requested medication (s) are on the active medication list: ye  Last refill:  03/26/18  Future visit scheduled: no  Notes to clinic:  Called pt and number was not in service. Pt > 3 months overdue for appt sending back to [place]   Requested Prescriptions  Pending Prescriptions Disp Refills   amLODipine (NORVASC) 5 MG tablet [Pharmacy Med Name: AMLODIPINE BESYLATE 5MG  TABLETS] 90 tablet 0    Sig: TAKE 1 TABLET BY MOUTH DAILY. OFFICE VISIT NEEDED FOR REFILLS     Cardiovascular:  Calcium Channel Blockers Failed - 06/22/2018 10:06 AM      Failed - Last BP in normal range    BP Readings from Last 1 Encounters:  03/17/18 (!) 152/86         Passed - Valid encounter within last 6 months    Recent Outpatient Visits          5 months ago Seborrhea   Primary Care at [facility], Sandria Bales, MD   10 months ago Well-controlled hypertension   Primary Care at [facility], Marolyn Hammock, PA-C   11 months ago Muscle cramps   Primary Care at [facility], Manus Rudd, MD   1 year ago Umbilical hernia without obstruction and without gangrene   Primary Care at [facility], Dema Severin, PA-C   1 year ago Essential hypertension   Primary Care at [facility], Marolyn Hammock, PA-C

## 2018-07-21 ENCOUNTER — Other Ambulatory Visit: Payer: Self-pay | Admitting: Family Medicine

## 2018-07-21 DIAGNOSIS — I10 Essential (primary) hypertension: Secondary | ICD-10-CM

## 2018-07-23 ENCOUNTER — Other Ambulatory Visit: Payer: Self-pay | Admitting: Family Medicine

## 2018-07-23 DIAGNOSIS — I10 Essential (primary) hypertension: Secondary | ICD-10-CM

## 2018-07-23 NOTE — Telephone Encounter (Signed)
Requested medication (s) are due for refill today - yes  Requested medication (s) are on the active medication list -yes  Future visit scheduled -no  Last refill: 06/22/18  Notes to clinic: Patient has signed release to Dr polite- Rx courtesy RF request patient call for appointment- attempted to call home number- not in service. Sent for review of refill request.   Requested Prescriptions  Pending Prescriptions Disp Refills   amLODipine (NORVASC) 5 MG tablet [Pharmacy Med Name: AMLODIPINE BESYLATE 5MG  TABLETS] 30 tablet 0    Sig: TAKE 1 TABLET BY MOUTH DAILY. OFFICE VISIT NEEDED FOR REFILLS     Cardiovascular:  Calcium Channel Blockers Failed - 07/23/2018 10:25 AM      Failed - Last BP in normal range    BP Readings from Last 1 Encounters:  03/17/18 (!) 152/86         Failed - Valid encounter within last 6 months    Recent Outpatient Visits          7 months ago Seborrhea   Primary Care at [facility], Sandria Bales, MD   11 months ago Well-controlled hypertension   Primary Care at [facility], Marolyn Hammock, PA-C   1 year ago Muscle cramps   Primary Care at [facility], Manus Rudd, MD   1 year ago Umbilical hernia without obstruction and without gangrene   [facility] Care at [facility], Dema Severin, PA-C   1 year ago Essential hypertension   [facility] at [facility], Marolyn Hammock, PA-C              Requested Prescriptions  Pending Prescriptions Disp Refills   amLODipine (NORVASC) 5 MG tablet [Pharmacy Med Name: AMLODIPINE BESYLATE 5MG  TABLETS] 30 tablet 0    Sig: TAKE 1 TABLET BY MOUTH DAILY. OFFICE VISIT NEEDED FOR REFILLS     Cardiovascular:  Calcium Channel Blockers Failed - 07/23/2018 10:25 AM      Failed - Last BP in normal range    BP Readings from Last 1 Encounters:  03/17/18 (!) 152/86         Failed - Valid encounter within last 6 months    Recent Outpatient Visits          7 months ago Seborrhea   Primary Care at [facility], Sandria Bales, MD   11 months ago  Well-controlled hypertension   Primary Care at [facility], Marolyn Hammock, PA-C   1 year ago Muscle cramps   Primary Care at [facility], Manus Rudd, MD   1 year ago Umbilical hernia without obstruction and without gangrene   Primary Care at [facility], Dema Severin, PA-C   1 year ago Essential hypertension   Primary Care at [facility], Marolyn Hammock, PA-C

## 2018-09-11 NOTE — Telephone Encounter (Signed)
Pt is seen by [facility]

## 2018-09-30 IMAGING — DX DG CHEST 2V
2 series · 2 of 2 positions shown · non-contrast
Comparison: November 10, 2016

CLINICAL DATA: Uncontrolled hypertension

EXAM:
CHEST - 2 VIEW

[chest pa]
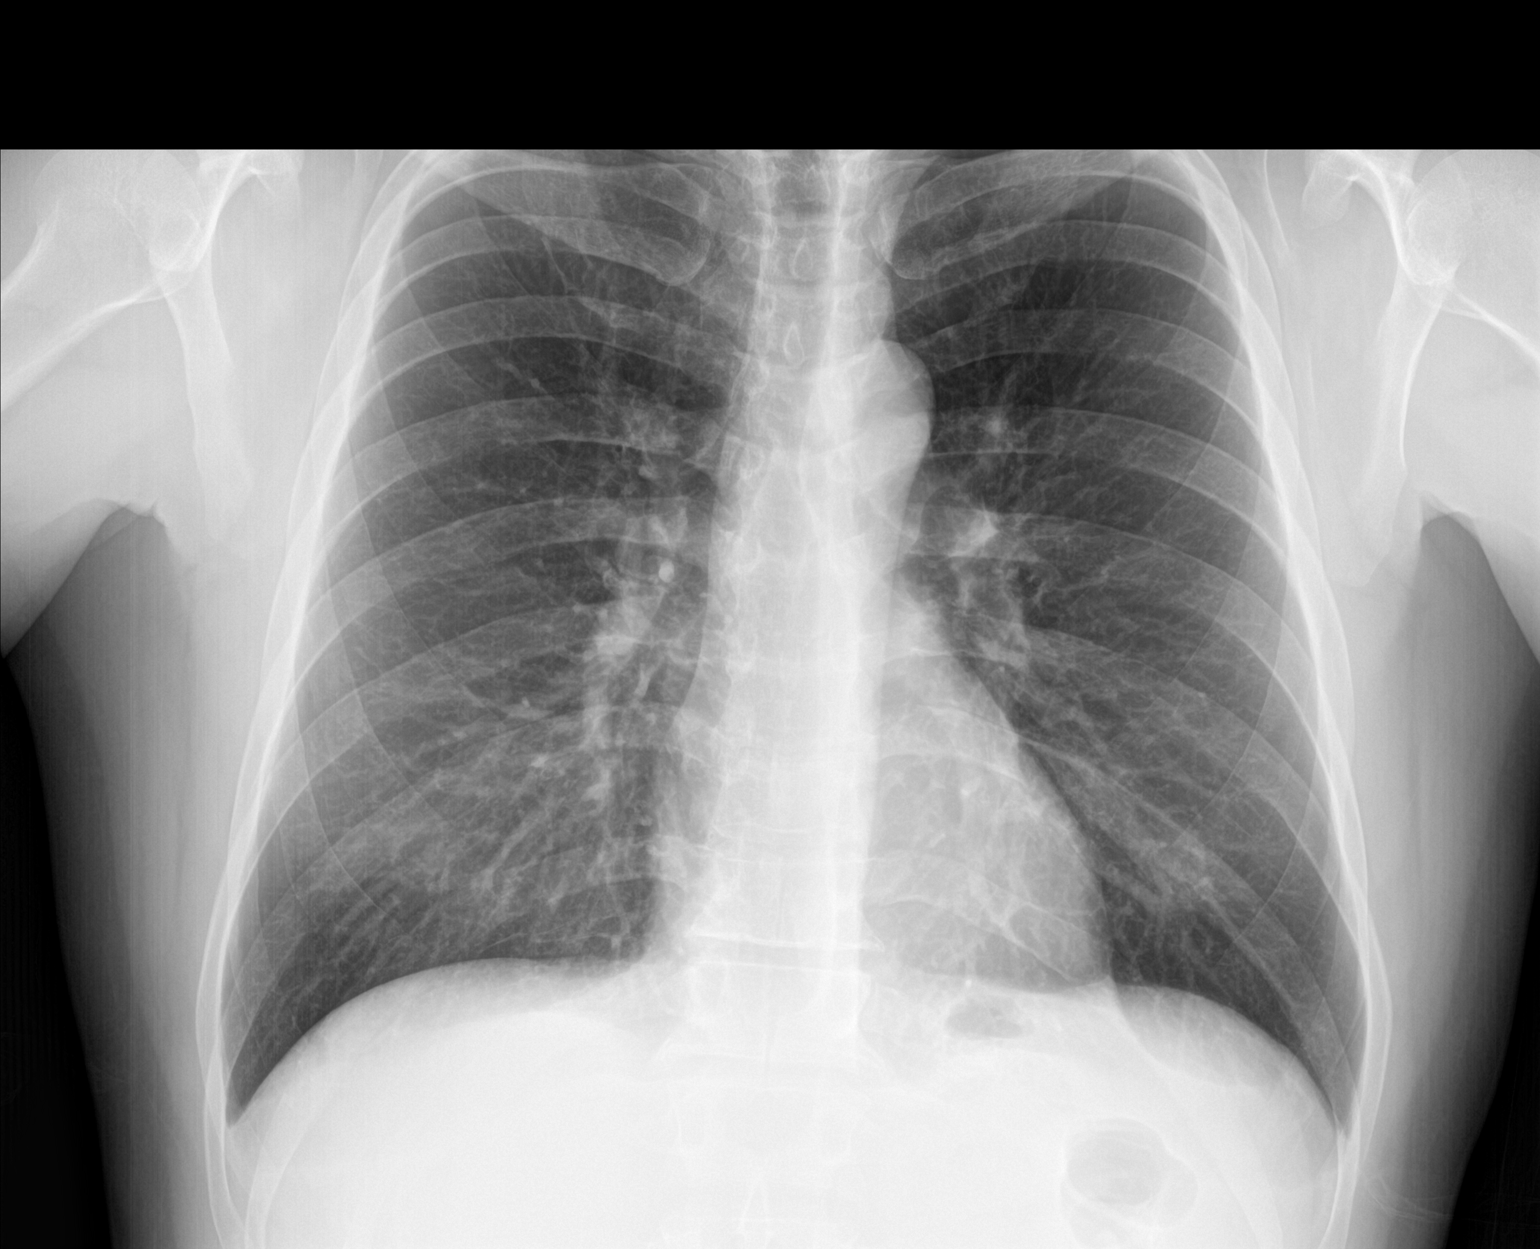

[chest lat]
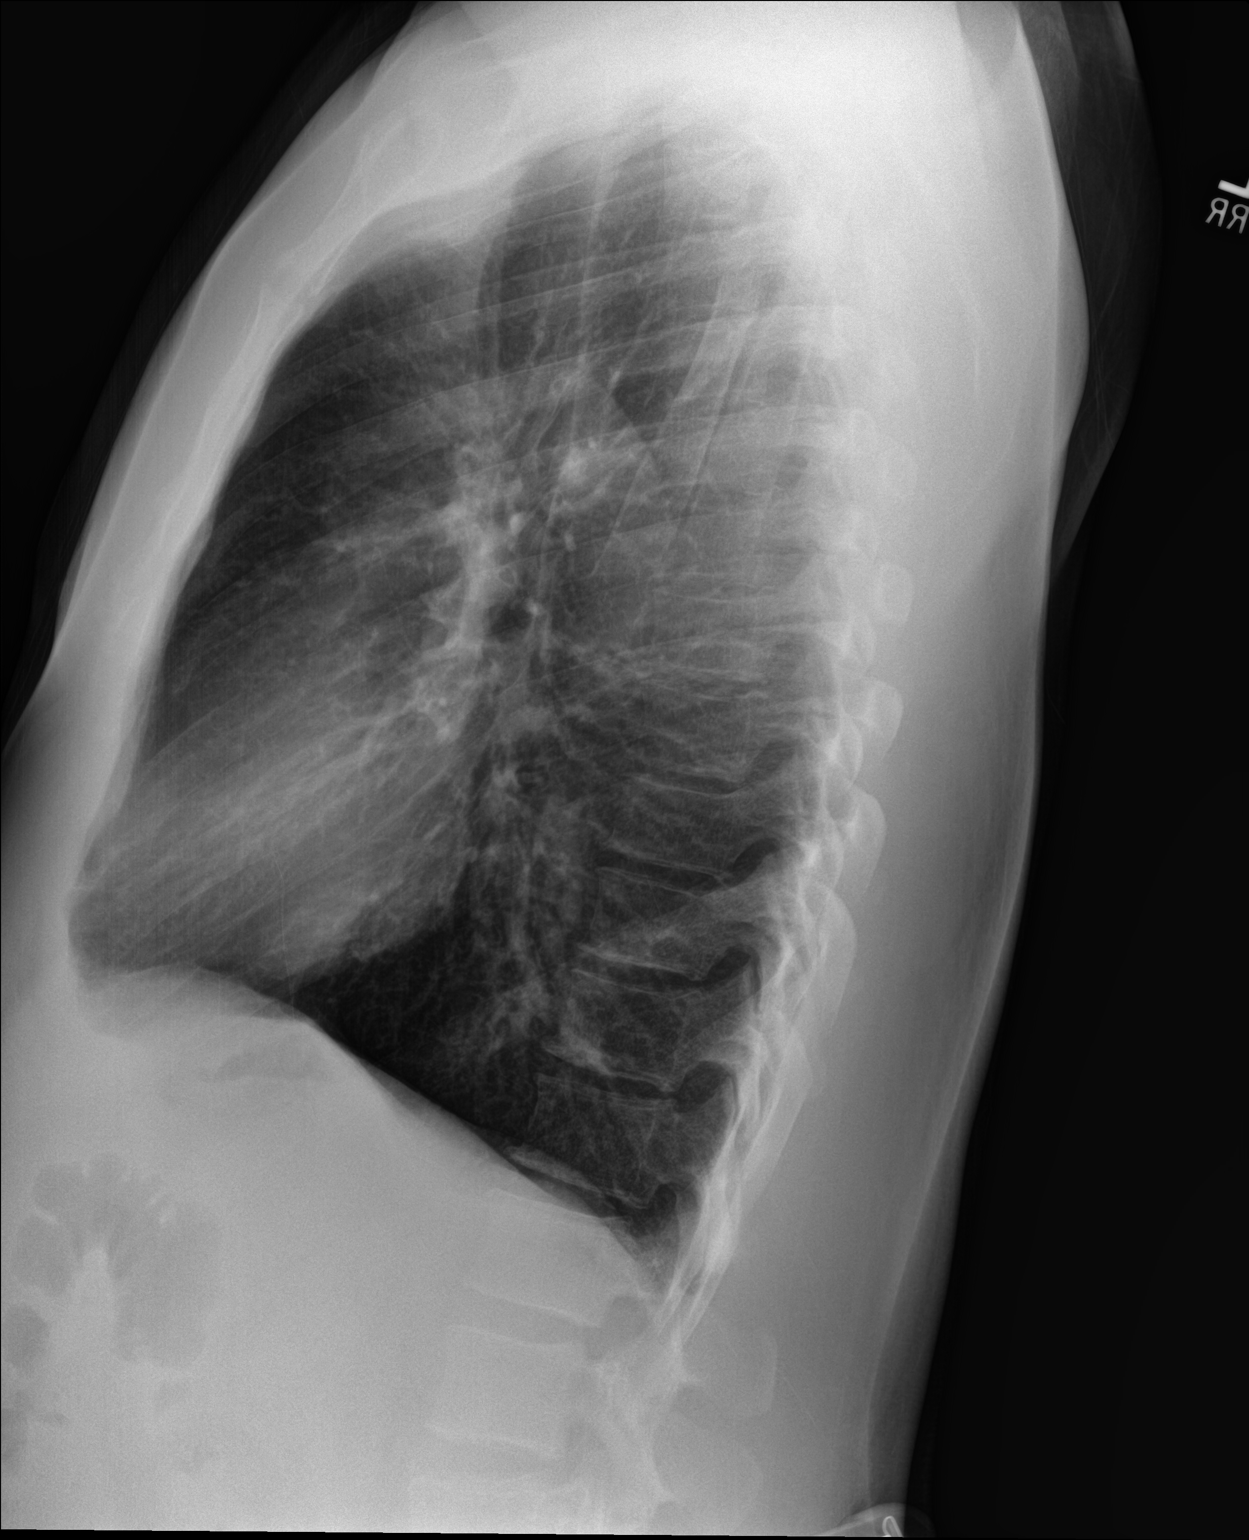

[2 of 2 positions shown; findings below may reference images not displayed]

FINDINGS: The heart size and mediastinal contours are within normal limits.
Both lungs are clear. The visualized skeletal structures are
unremarkable.
IMPRESSION: No active cardiopulmonary disease.

## 2018-12-17 ENCOUNTER — Other Ambulatory Visit: Payer: Self-pay | Admitting: Family Medicine

## 2018-12-17 DIAGNOSIS — J45909 Unspecified asthma, uncomplicated: Secondary | ICD-10-CM

## 2018-12-17 NOTE — Telephone Encounter (Signed)
Requested medication (s) are due for refill today: yes  Requested medication (s) are on the active medication list: yes  Last refill:  10/09/2018  Future visit scheduled: no  Notes to clinic:  Review for refill   Requested Prescriptions  Pending Prescriptions Disp Refills   albuterol (PROVENTIL) (2.5 MG/3ML) 0.083% nebulizer solution [Pharmacy Med Name: ALBUTEROL 0.083%(2.5MG /3ML) 25X3ML] 75 mL 3    Sig: USE 1 VIAL VIA NEBULIZER EVERY 6 HOURS AS NEEDED FOR WHEEZING OR SHORTNESS OF BREATH     Pulmonology:  Beta Agonists Failed - 12/17/2018  9:11 AM      Failed - One inhaler should last at least one month. If the patient is requesting refills earlier, contact the patient to check for uncontrolled symptoms.      Passed - Valid encounter within last 12 months    Recent Outpatient Visits          11 months ago Seborrhea   Primary Care at [facility], Fenton Malling, MD   1 year ago Well-controlled hypertension   Primary Care at [facility], PA-C   1 year ago Muscle cramps   Primary Care at [facility], Arlie Solomons, MD   1 year ago Umbilical hernia without obstruction and without gangrene   [facility] at [facility], Damaris Hippo, PA-C   1 year ago Essential hypertension   Primary Care at [facility], Audrie Lia, PA-C

## 2019-02-02 ENCOUNTER — Other Ambulatory Visit: Payer: Self-pay

## 2019-02-02 DIAGNOSIS — Z20822 Contact with and (suspected) exposure to covid-19: Secondary | ICD-10-CM

## 2019-02-03 LAB — NOVEL CORONAVIRUS, NAA: SARS-CoV-2, NAA: NOT DETECTED

## 2019-08-18 ENCOUNTER — Emergency Department (HOSPITAL_COMMUNITY)
Admission: EM | Admit: 2019-08-18 | Discharge: 2019-08-18 | Discharge status: Against Medical Advice | Payer: BLUE CROSS/BLUE SHIELD | Attending: Emergency Medicine | Admitting: Emergency Medicine

## 2019-08-18 ENCOUNTER — Encounter (HOSPITAL_COMMUNITY): Payer: Self-pay | Admitting: Emergency Medicine

## 2019-08-18 ENCOUNTER — Other Ambulatory Visit: Payer: Self-pay

## 2019-08-18 ENCOUNTER — Emergency Department (HOSPITAL_COMMUNITY): Payer: BLUE CROSS/BLUE SHIELD

## 2019-08-18 DIAGNOSIS — Z79899 Other long term (current) drug therapy: Secondary | ICD-10-CM | POA: Diagnosis not present

## 2019-08-18 DIAGNOSIS — J45909 Unspecified asthma, uncomplicated: Secondary | ICD-10-CM | POA: Diagnosis not present

## 2019-08-18 DIAGNOSIS — I10 Essential (primary) hypertension: Secondary | ICD-10-CM | POA: Diagnosis not present

## 2019-08-18 DIAGNOSIS — R0789 Other chest pain: Secondary | ICD-10-CM | POA: Insufficient documentation

## 2019-08-18 DIAGNOSIS — R072 Precordial pain: Secondary | ICD-10-CM

## 2019-08-18 DIAGNOSIS — F17210 Nicotine dependence, cigarettes, uncomplicated: Secondary | ICD-10-CM | POA: Insufficient documentation

## 2019-08-18 LAB — CBC
HCT: 46.9 % (ref 39.0–52.0)
Hemoglobin: 15.1 g/dL (ref 13.0–17.0)
MCH: 26.2 pg (ref 26.0–34.0)
MCHC: 32.2 g/dL (ref 30.0–36.0)
MCV: 81.3 fL (ref 80.0–100.0)
Platelets: 253 10*3/uL (ref 150–400)
RBC: 5.77 MIL/uL (ref 4.22–5.81)
RDW: 14.4 % (ref 11.5–15.5)
WBC: 7.7 10*3/uL (ref 4.0–10.5)
nRBC: 0 % (ref 0.0–0.2)

## 2019-08-18 LAB — HEPATIC FUNCTION PANEL
ALT: 55 U/L — ABNORMAL HIGH (ref 0–44)
AST: 50 U/L — ABNORMAL HIGH (ref 15–41)
Albumin: 3.8 g/dL (ref 3.5–5.0)
Alkaline Phosphatase: 66 U/L (ref 38–126)
Bilirubin, Direct: 0.1 mg/dL (ref 0.0–0.2)
Indirect Bilirubin: 1.1 mg/dL — ABNORMAL HIGH (ref 0.3–0.9)
Total Bilirubin: 1.2 mg/dL (ref 0.3–1.2)
Total Protein: 7.5 g/dL (ref 6.5–8.1)

## 2019-08-18 LAB — BASIC METABOLIC PANEL
Anion gap: 10 (ref 5–15)
BUN: 16 mg/dL (ref 6–20)
CO2: 25 mmol/L (ref 22–32)
Calcium: 9.1 mg/dL (ref 8.9–10.3)
Chloride: 104 mmol/L (ref 98–111)
Creatinine, Ser: 1.16 mg/dL (ref 0.61–1.24)
GFR calc Af Amer: >60 mL/min (ref >60)
GFR calc non Af Amer: >60 mL/min (ref >60)
Glucose, Bld: 91 mg/dL (ref 70–99)
Potassium: 3.8 mmol/L (ref 3.5–5.1)
Sodium: 139 mmol/L (ref 135–145)

## 2019-08-18 LAB — TROPONIN I (HIGH SENSITIVITY)
Troponin I (High Sensitivity): 7 ng/L (ref <18)
Troponin I (High Sensitivity): 7 ng/L (ref <18)

## 2019-08-18 LAB — LIPASE, BLOOD: Lipase: 62 U/L — ABNORMAL HIGH (ref 11–51)

## 2019-08-18 MED ORDER — SODIUM CHLORIDE 0.9% FLUSH: 3.0000 mL | Freq: Once | INTRAVENOUS | Status: DC

## 2019-08-18 MED ORDER — LIDOCAINE VISCOUS HCL 2 % MT SOLN
15.0000 mL | Freq: Once | OROMUCOSAL | Status: AC
  Administered 2019-08-18: 15 mL via ORAL
  Filled 2019-08-18: qty 15

## 2019-08-18 MED ORDER — ALUM & MAG HYDROXIDE-SIMETH 200-200-20 MG/5ML PO SUSP
30.0000 mL | Freq: Once | ORAL | Status: AC
  Administered 2019-08-18: 30 mL via ORAL
  Filled 2019-08-18: qty 30

## 2019-08-18 NOTE — ED Triage Notes (Signed)
Pt states that he noticed central chest pain when rolling over last night in bed, upon waking, he c/o central to left chest "soreness" denies any strenuous activity prior to pain. Pain worse with movement. resp e/u, nad.

## 2019-08-18 NOTE — Discharge Instructions (Signed)
Your history and exam today are consistent with a likely noncardiac cause of your chest pain.  Given the food you ate last night before bed, I am concerned that reflux may play a role in your discomfort.  You eloped prior to all the results finishing and had you stated, we would have discussed a right upper quadrant ultrasound to look for gallbladder cause given the slightly elevation of your liver function and your lipase.  Please follow-up with your primary doctor.  If any symptoms change or worsen, please return to the nearest emergency department.

## 2019-08-18 NOTE — ED Provider Notes (Signed)
[facility] EMERGENCY DEPARTMENT Provider Note   CSN: [id number] Arrival date & time: 08/18/19  0809     History Chief Complaint  Patient presents with  . Chest Pain    Casey [name] is a 49 y.o. male.  The history is provided by the patient and medical records. No language interpreter was used.  Chest Pain Pain location:  Substernal area Pain quality: aching and dull   Pain radiates to:  Does not radiate Pain severity:  Moderate Onset quality:  Gradual Duration:  6 hours Timing:  Constant Progression:  Improving Chronicity:  New Relieved by:  Nothing Worsened by:  Nothing Ineffective treatments:  None tried Associated symptoms: anxiety   Associated symptoms: no abdominal pain, no back pain, no claudication, no cough, no diaphoresis, no dizziness, no dysphagia, no fatigue, no fever, no headache, no lower extremity edema, no nausea, no near-syncope, no numbness, no palpitations, no shortness of breath, no vomiting and no weakness   Risk factors: male sex        Past Medical History:  Diagnosis Date  . Anemia   . Asthma    diagnosed in childhood; age 33.  No hospitalizations.    . Hypertension    onset age 72.    Patient Active Problem List   Diagnosis Date Noted  . Rash, skin 03/15/2016  . Other seasonal allergic rhinitis 01/02/2014  . External hemorrhoids 01/02/2014  . Insomnia 01/02/2014  . Essential hypertension 11/14/2013  . Palpitations 11/14/2013  . Generalized anxiety disorder 11/14/2013  . Asthma 10/24/2012    Past Surgical History:  Procedure Laterality Date  . NO PAST SURGERIES         Family History  Problem Relation Age of Onset  . Hypertension Father   . Stroke Father 26       CVA; recurrent CVA at time of death  . Asthma Brother     Social History   Tobacco Use  . Smoking status: Current Every Day Smoker    Packs/day: 0.25    Types: Cigarettes    Last attempt to quit: 05/05/2012    Years since quitting: 7.2    . Smokeless tobacco: Never Used  . Tobacco comment: working to cut back  Substance Use Topics  . Alcohol use: Yes    Alcohol/week: 0.0 - 4.0 standard drinks    Comment: most weeks doesn't drink at all  . Drug use: No    Home Medications Prior to Admission medications   Medication Sig Start Date End Date Taking? Authorizing Provider  albuterol (PROVENTIL) (2.5 MG/3ML) 0.083% nebulizer solution USE 1 VIAL VIA NEBULIZER Q 6 H PRF WHEEZING OR SOB 12/25/17   Peyton Najjar, MD  amLODipine (NORVASC) 5 MG tablet TAKE 1 TABLET BY MOUTH DAILY. OFFICE VISIT NEEDED FOR REFILLS 06/22/18   Myles Lipps, MD  Aspirin-Salicylamide-Caffeine [facility] HEADACHE POWDER PO) Take 1 Package by mouth daily as needed (for chest pain).    [provider]  baclofen (LIORESAL) 10 MG tablet Take 1 tablet (10 mg total) by mouth 3 (three) times daily. Patient not taking: Reported on 12/25/2017 07/13/17   Doristine Bosworth, MD  beclomethasone (QVAR) 40 MCG/ACT inhaler Inhale 2 puffs into the lungs 2 (two) times daily. 12/25/17   Peyton Najjar, MD  Clobetasol Propionate 0.05 % shampoo Use twice daily in the shower. Patient not taking: Reported on 08/12/2017 01/09/17   Ofilia Neas, PA-C  escitalopram (LEXAPRO) 5 MG tablet Take 0.5 tablets (2.5  mg total) by mouth daily. Patient not taking: Reported on 12/25/2017 08/12/17   Tereasa Coop, PA-C  fexofenadine (ALLEGRA) 60 MG tablet Take 60 mg by mouth daily as needed for allergies or rhinitis.    [provider]  fluocinonide (LIDEX) 0.05 % external solution Apply 1 application topically 2 (two) times daily. 12/25/17   Posey Boyer, MD  fluticasone (FLOVENT HFA) 220 MCG/ACT inhaler Inhale 2 puffs into the lungs 2 (two) times daily. Patient not taking: Reported on 12/25/2017 06/20/17   Mancel Bale, PA-C  meclizine (ANTIVERT) 25 MG tablet Take 1 tablet (25 mg total) by mouth 3 (three) times daily as needed for dizziness. 03/17/18   Fawze, Mina A, PA-C   meloxicam (MOBIC) 7.5 MG tablet Take 1 tablet (7.5 mg total) by mouth daily. Patient not taking: Reported on 08/12/2017 07/13/17   Forrest Moron, MD  montelukast (SINGULAIR) 10 MG tablet Take 1 tablet (10 mg total) by mouth at bedtime. 12/25/17   Posey Boyer, MD  triamcinolone cream (KENALOG) 0.1 % Apply 1 application topically 2 (two) times daily. Patient not taking: Reported on 12/25/2017 01/09/17   Tereasa Coop, PA-C  VENTOLIN HFA 108 (90 Base) MCG/ACT inhaler INHALE 1 TO 2 PUFFS BY MOUTH EVERY 6 HOURS AS NEEDED FOR WHEEZING OR SHORTNESS OF BREATH 06/20/18   Rutherford Guys, MD    Allergies    Other  Review of Systems   Review of Systems  Constitutional: Negative for chills, diaphoresis, fatigue and fever.  HENT: Negative for congestion and trouble swallowing.   Eyes: Negative for visual disturbance.  Respiratory: Negative for cough, chest tightness, shortness of breath and wheezing.   Cardiovascular: Positive for chest pain. Negative for palpitations, claudication and near-syncope.  Gastrointestinal: Negative for abdominal pain, constipation, diarrhea, nausea and vomiting.  Genitourinary: Negative for flank pain.  Musculoskeletal: Negative for back pain, neck pain and neck stiffness.  Skin: Negative for rash and wound.  Neurological: Negative for dizziness, speech difficulty, weakness, light-headedness, numbness and headaches.  Psychiatric/Behavioral: Negative for agitation.  All other systems reviewed and are negative.   Physical Exam Updated Vital Signs BP (!) 133/92   Pulse 66   Temp 97.8 F (36.6 C) (Oral)   Resp 16   SpO2 100%   Physical Exam Vitals and nursing note reviewed.  Constitutional:      General: He is not in acute distress.    Appearance: He is well-developed. He is not ill-appearing, toxic-appearing or diaphoretic.  HENT:     Head: Normocephalic and atraumatic.  Eyes:     Extraocular Movements: Extraocular movements intact.      Conjunctiva/sclera: Conjunctivae normal.     Pupils: Pupils are equal, round, and reactive to light.  Cardiovascular:     Rate and Rhythm: Normal rate and regular rhythm.     Heart sounds: No murmur.  Pulmonary:     Effort: Pulmonary effort is normal. No tachypnea or respiratory distress.     Breath sounds: Normal breath sounds. No decreased breath sounds, wheezing, rhonchi or rales.  Abdominal:     Palpations: Abdomen is soft.     Tenderness: There is no abdominal tenderness.  Musculoskeletal:     Cervical back: Neck supple.     Right lower leg: No tenderness. No edema.     Left lower leg: No tenderness. No edema.  Skin:    General: Skin is warm and dry.     Capillary Refill: Capillary refill takes less than 2  seconds.  Neurological:     Mental Status: He is alert.  Psychiatric:        Mood and Affect: Mood normal.     ED Results / Procedures / Treatments   Labs (all labs ordered are listed, but only abnormal results are displayed) Labs Reviewed  LIPASE, BLOOD - Abnormal; Notable for the following components:      Result Value   Lipase 62 (*)    All other components within normal limits  HEPATIC FUNCTION PANEL - Abnormal; Notable for the following components:   AST 50 (*)    ALT 55 (*)    Indirect Bilirubin 1.1 (*)    All other components within normal limits  BASIC METABOLIC PANEL  CBC  TROPONIN I (HIGH SENSITIVITY)  TROPONIN I (HIGH SENSITIVITY)    EKG EKG Interpretation  Date/Time:  Sunday Aug 18 2019 08:09:18 EDT Ventricular Rate:  82 PR Interval:  150 QRS Duration: 92 QT Interval:  400 QTC Calculation: 467 R Axis:   75 Text Interpretation: Normal sinus rhythm with sinus arrhythmia Minimal voltage criteria for LVH, may be normal variant ( Sokolow-Lyon ) Borderline ECG When cmpared to priorl no signicfiant cahnges seen. no STEMI Confirmed by Theda Belfast ([id number]) on 08/18/2019 10:04:19 AM   Radiology DG Chest 2 View  Result Date: 08/18/2019 CLINICAL  DATA:  Central chest pain last night. EXAM: CHEST - 2 VIEW COMPARISON:  Chest x-ray dated 08/12/2017. FINDINGS: The heart size and mediastinal contours are within normal limits. Both lungs are clear. The visualized skeletal structures are unremarkable. IMPRESSION: No active cardiopulmonary disease.  Lungs are clear. Electronically Signed   By: Bary Richard M.D.   On: 08/18/2019 08:50    Procedures Procedures (including critical care time)  Medications Ordered in ED Medications  sodium chloride flush (NS) 0.9 % injection 3 mL (has no administration in time range)  alum & mag hydroxide-simeth (MAALOX/MYLANTA) 200-200-20 MG/5ML suspension 30 mL (30 mLs Oral Given 08/18/19 1007)    And  lidocaine (XYLOCAINE) 2 % viscous mouth solution 15 mL (15 mLs Oral Given 08/18/19 1007)    ED Course  I have reviewed the triage vital signs and the nursing notes.  Pertinent labs & imaging results that were available during my care of the patient were reviewed by me and considered in my medical decision making (see chart for details).    MDM Rules/Calculators/A&P                      Casey [name] is a 49 y.o. male with a past medical history significant for asthma, hypertension, anxiety, and seasonal allergies who presents with chest pain.  Patient reports that his discomfort woke him up overnight and was some soreness in his central chest.  He reports it was worse with different positions but was not worse with deep breathing or exertion.  He reports he has had this pain almost monthly for the last few months.  He reports no recent trauma.  He does report he had a hot dog with chili on it last night before bed.  He has no history of reflux but his significant other reports that she thinks he has had an ulcer and a hernia in the past.  Patient denies any abdominal pain currently and reports his chest pain is mild to moderate is a 4 out of 10 currently.  He reports that it does not radiate into his arms or neck  or back.  He has  had no recent fevers, chills, congestion, or cough.  No recent Covid exposures.  He denies history of DVT or PE and denies any leg pain or leg swelling.  Denies any urinary or GI symptoms otherwise.  On exam, lungs are clear with no wheezing.  Chest abdomen are nontender.  No murmur.  Good pulses in all extremities.  No lower extremity tenderness or edema.  Patient resting comfortably without distress.  EKG shows no STEMI and appears similar to prior.  Clinically I suspect patient symptoms related to anxiety versus reflux given the verbal report of possible ulcer and the preceding chili dog last night.  We will however we will get labs, chest x-ray, and give him a GI cocktail to see if this helps.  We will make sure this is not a cardiac or pulmonary etiology.  He is PERC negative, low suspicion for PE at this time.  Chest x-ray was unremarkable with no pneumothorax or pneumonia or other normality seen.  Initial troponin was negative, will trend. CBC and BMP reassuring, will add hepatic function lipase given the location of his chest discomfort.  Anticipate reassessment after work-up and anticipate discharge home if all is reassuring and he is feeling better.  Second troponin returned normal.  Hepatic function slightly elevated as is lipase.  Prior to reassessment and offering the patient a right upper quadrant ultrasound to rule out gallbladder etiology, patient eloped from the emergency department.  Patient told nursing that he was feeling much better after GI cocktail and saw his results and wanted to leave.  We did discuss that if he was feeling better, plan of care would likely to have him follow-up with gastroenterology however he was unable to get this information prior to leaving.  If he returns, would consider repeat of his hepatic function, lipase, and consider right upper quadrant ultrasound if he is feeling worse.  Patient eloped in stable condition.    Final Clinical  Impression(s) / ED Diagnoses Final diagnoses:  Precordial pain    Clinical Impression: 1. Precordial pain     Disposition: Eloped  Condition: Stable     Vickey Ewbank, Canary Brim, MD 08/18/19 1227

## 2019-08-18 NOTE — ED Notes (Signed)
Pt stated he saw his results on Mychart and wanted to leave. MD made aware.

## 2020-10-05 IMAGING — CR DG CHEST 2V
2 series · 2 of 2 positions shown · non-contrast
Comparison: Chest x-ray dated 08/12/2017.

CLINICAL DATA: Central chest pain last night.

EXAM:
CHEST - 2 VIEW

[chest pa]
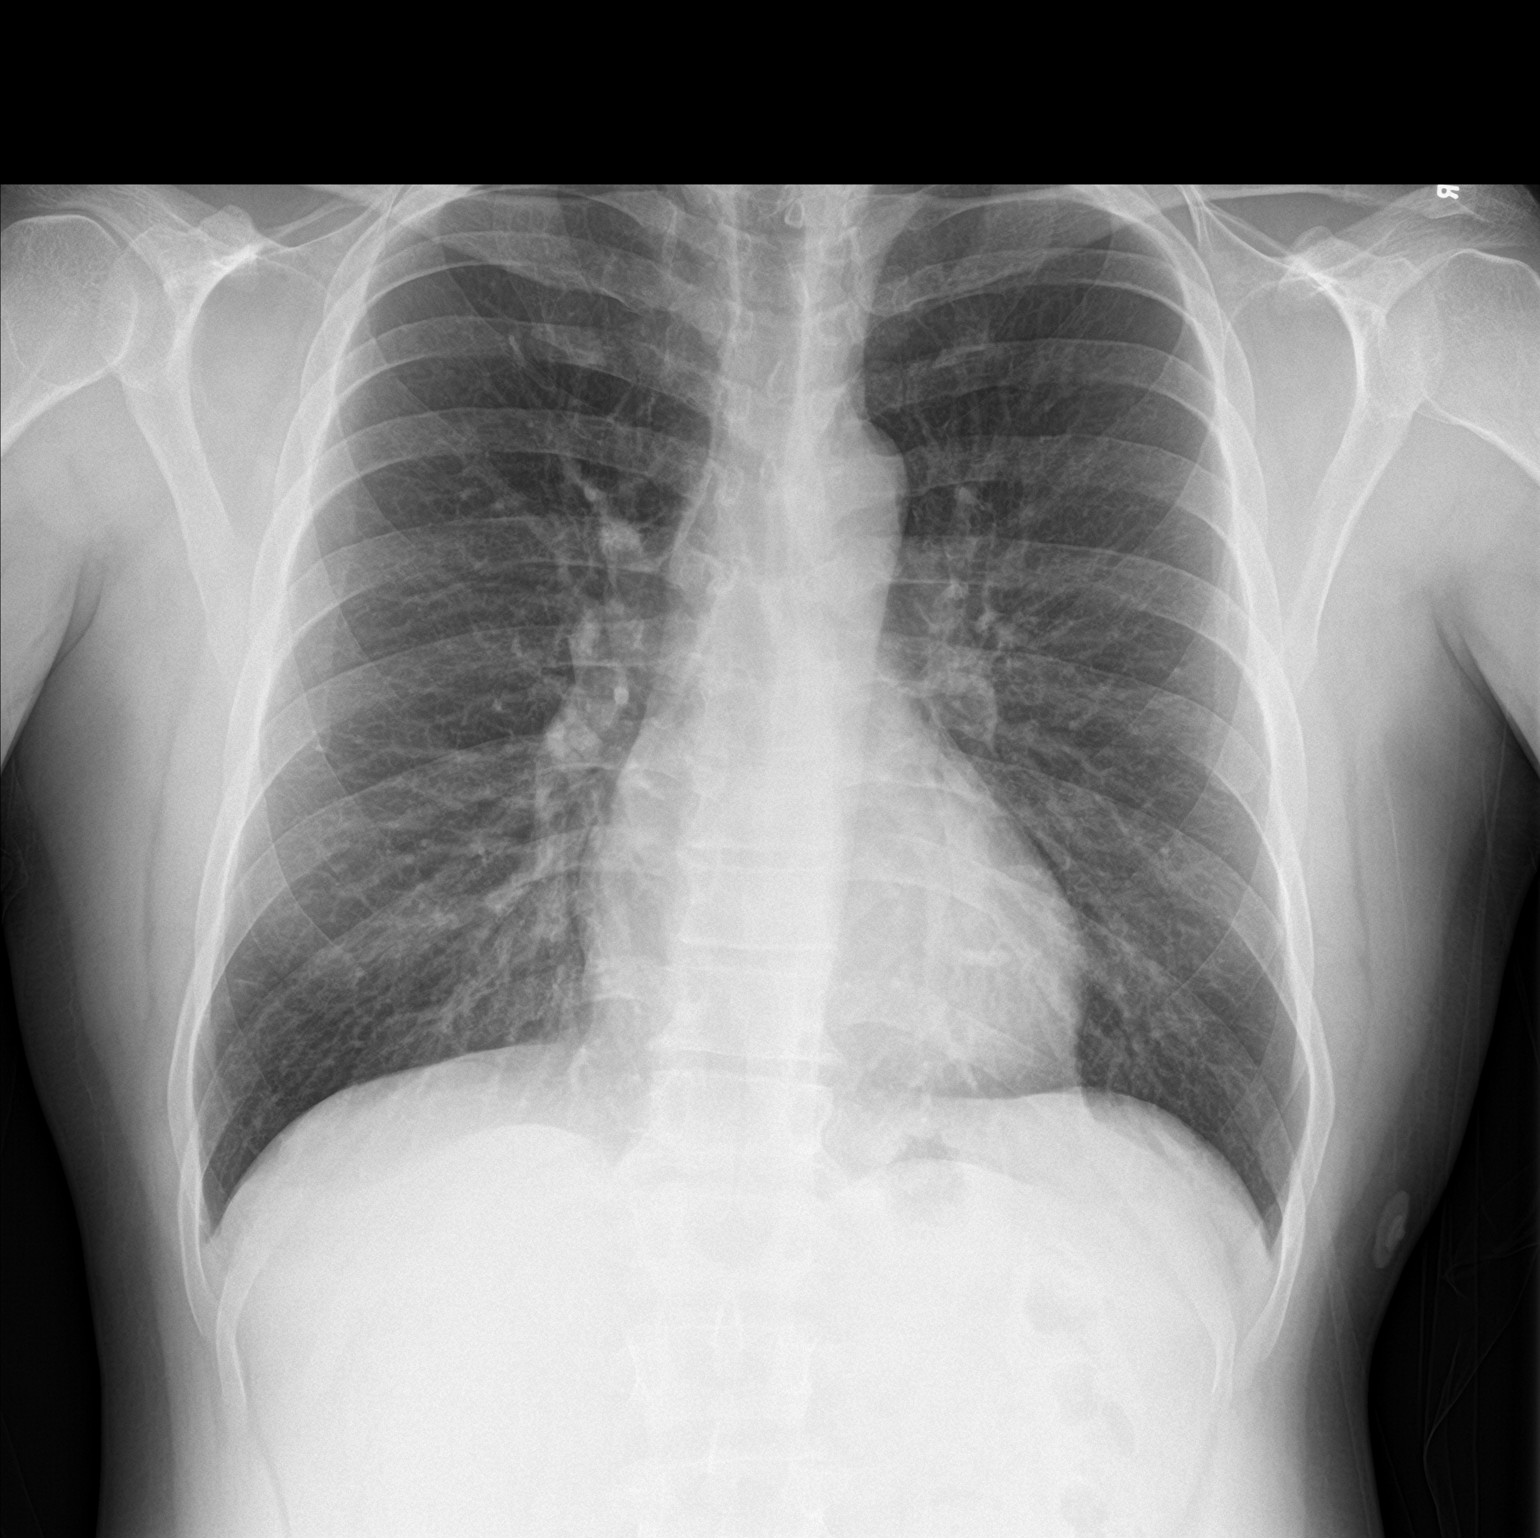

[chest lat]
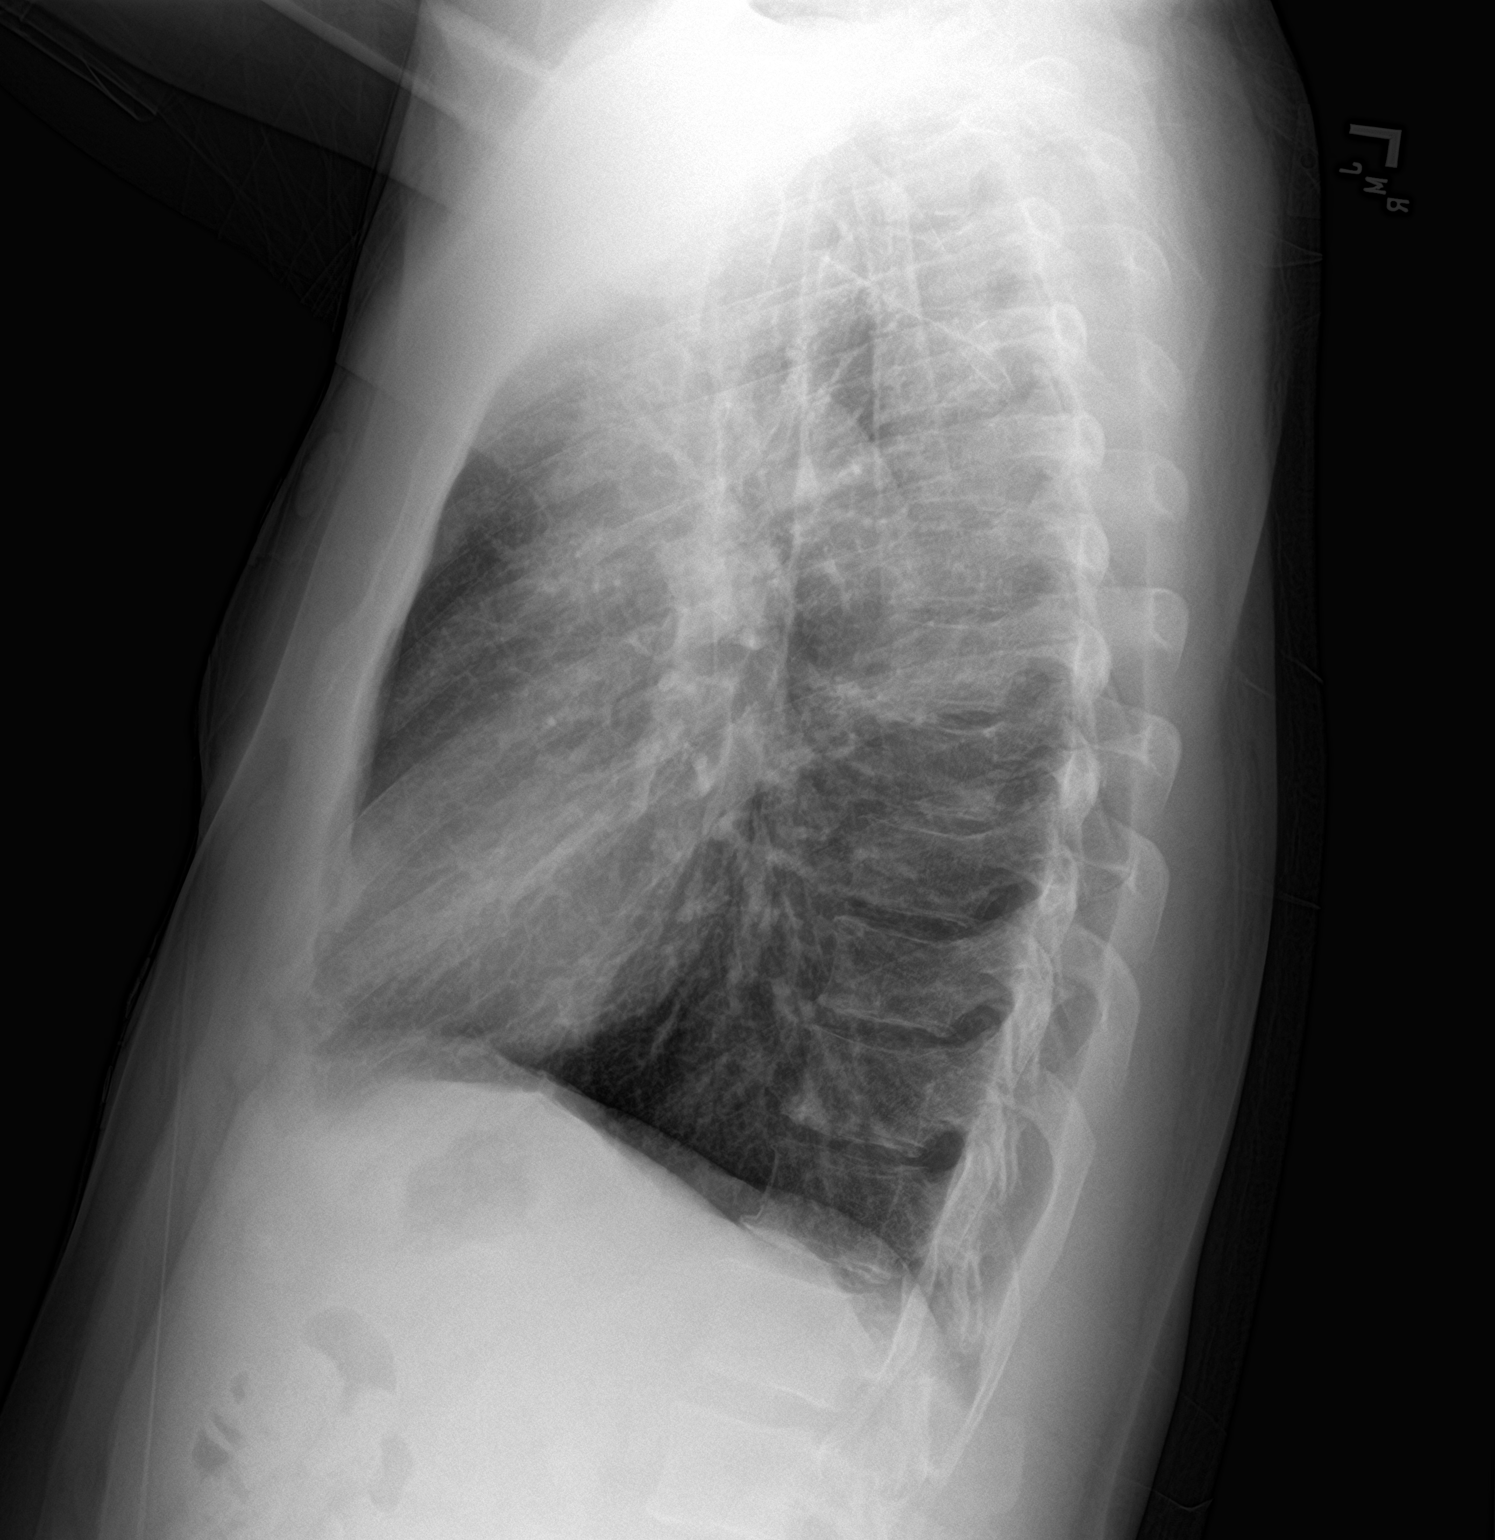

[2 of 2 positions shown; findings below may reference images not displayed]

FINDINGS: The heart size and mediastinal contours are within normal limits.
Both lungs are clear. The visualized skeletal structures are
unremarkable.
IMPRESSION: No active cardiopulmonary disease.  Lungs are clear.

## 2021-04-20 DIAGNOSIS — K648 Other hemorrhoids: Secondary | ICD-10-CM | POA: Diagnosis not present

## 2021-04-20 DIAGNOSIS — D124 Benign neoplasm of descending colon: Secondary | ICD-10-CM | POA: Diagnosis not present

## 2021-04-20 DIAGNOSIS — D122 Benign neoplasm of ascending colon: Secondary | ICD-10-CM | POA: Diagnosis not present

## 2021-04-20 DIAGNOSIS — D125 Benign neoplasm of sigmoid colon: Secondary | ICD-10-CM | POA: Diagnosis not present

## 2021-04-20 DIAGNOSIS — Z1211 Encounter for screening for malignant neoplasm of colon: Secondary | ICD-10-CM | POA: Diagnosis not present

## 2021-04-23 DIAGNOSIS — D122 Benign neoplasm of ascending colon: Secondary | ICD-10-CM | POA: Diagnosis not present

## 2021-04-23 DIAGNOSIS — D124 Benign neoplasm of descending colon: Secondary | ICD-10-CM | POA: Diagnosis not present

## 2021-04-23 DIAGNOSIS — D125 Benign neoplasm of sigmoid colon: Secondary | ICD-10-CM | POA: Diagnosis not present

## 2021-07-21 DIAGNOSIS — L400 Psoriasis vulgaris: Secondary | ICD-10-CM | POA: Diagnosis not present

## 2021-07-21 DIAGNOSIS — L408 Other psoriasis: Secondary | ICD-10-CM | POA: Diagnosis not present

## 2021-08-06 ENCOUNTER — Emergency Department (HOSPITAL_COMMUNITY)
Admission: EM | Admit: 2021-08-06 | Discharge: 2021-08-06 | Discharge status: Against Medical Advice | Payer: 59 | Attending: Emergency Medicine | Admitting: Emergency Medicine

## 2021-08-06 DIAGNOSIS — R040 Epistaxis: Secondary | ICD-10-CM | POA: Insufficient documentation

## 2021-08-06 DIAGNOSIS — R42 Dizziness and giddiness: Secondary | ICD-10-CM | POA: Insufficient documentation

## 2021-08-06 DIAGNOSIS — Z5321 Procedure and treatment not carried out due to patient leaving prior to being seen by health care provider: Secondary | ICD-10-CM | POA: Diagnosis not present

## 2021-08-06 NOTE — ED Triage Notes (Addendum)
Pt. Stated, ive had a nosebleed but not now, and my family has a history for blood disorders and I just want to get checked out. ?I have nosebleeds and after I feel dizzy. ?

## 2021-08-06 NOTE — ED Notes (Signed)
No answer from pt in waiting room 

## 2021-08-09 DIAGNOSIS — D649 Anemia, unspecified: Secondary | ICD-10-CM | POA: Diagnosis not present

## 2021-08-09 DIAGNOSIS — R040 Epistaxis: Secondary | ICD-10-CM | POA: Diagnosis not present

## 2021-10-24 DIAGNOSIS — R062 Wheezing: Secondary | ICD-10-CM | POA: Diagnosis not present

## 2021-10-24 DIAGNOSIS — B9689 Other specified bacterial agents as the cause of diseases classified elsewhere: Secondary | ICD-10-CM | POA: Diagnosis not present

## 2021-10-24 DIAGNOSIS — J069 Acute upper respiratory infection, unspecified: Secondary | ICD-10-CM | POA: Diagnosis not present

## 2021-10-24 DIAGNOSIS — R0602 Shortness of breath: Secondary | ICD-10-CM | POA: Diagnosis not present

## 2021-12-14 DIAGNOSIS — R040 Epistaxis: Secondary | ICD-10-CM | POA: Diagnosis not present

## 2022-02-09 DIAGNOSIS — B029 Zoster without complications: Secondary | ICD-10-CM | POA: Diagnosis not present

## 2022-02-09 DIAGNOSIS — B0239 Other herpes zoster eye disease: Secondary | ICD-10-CM | POA: Diagnosis not present

## 2022-03-04 DIAGNOSIS — Z419 Encounter for procedure for purposes other than remedying health state, unspecified: Secondary | ICD-10-CM | POA: Diagnosis not present

## 2022-04-04 DIAGNOSIS — Z419 Encounter for procedure for purposes other than remedying health state, unspecified: Secondary | ICD-10-CM | POA: Diagnosis not present

## 2022-05-09 ENCOUNTER — Telehealth: Payer: Self-pay

## 2022-05-09 NOTE — Telephone Encounter (Signed)
Mychart msg sent. AS, CMA 

## 2022-08-15 DIAGNOSIS — I509 Heart failure, unspecified: Secondary | ICD-10-CM | POA: Diagnosis not present

## 2022-08-15 DIAGNOSIS — Z6825 Body mass index (BMI) 25.0-25.9, adult: Secondary | ICD-10-CM | POA: Diagnosis not present

## 2022-08-15 DIAGNOSIS — E785 Hyperlipidemia, unspecified: Secondary | ICD-10-CM | POA: Diagnosis not present

## 2022-08-15 DIAGNOSIS — I10 Essential (primary) hypertension: Secondary | ICD-10-CM | POA: Diagnosis not present

## 2022-08-15 DIAGNOSIS — F5221 Male erectile disorder: Secondary | ICD-10-CM | POA: Diagnosis not present

## 2022-08-15 DIAGNOSIS — R002 Palpitations: Secondary | ICD-10-CM | POA: Diagnosis not present

## 2022-08-15 DIAGNOSIS — J302 Other seasonal allergic rhinitis: Secondary | ICD-10-CM | POA: Diagnosis not present

## 2023-02-07 DIAGNOSIS — I10 Essential (primary) hypertension: Secondary | ICD-10-CM | POA: Diagnosis not present

## 2023-02-07 DIAGNOSIS — J309 Allergic rhinitis, unspecified: Secondary | ICD-10-CM | POA: Diagnosis not present

## 2023-02-07 DIAGNOSIS — M25561 Pain in right knee: Secondary | ICD-10-CM | POA: Diagnosis not present

## 2023-02-07 DIAGNOSIS — E782 Mixed hyperlipidemia: Secondary | ICD-10-CM | POA: Diagnosis not present

## 2023-02-07 DIAGNOSIS — J4520 Mild intermittent asthma, uncomplicated: Secondary | ICD-10-CM | POA: Diagnosis not present

## 2023-02-07 DIAGNOSIS — E559 Vitamin D deficiency, unspecified: Secondary | ICD-10-CM | POA: Diagnosis not present

## 2024-03-13 ENCOUNTER — Ambulatory Visit
Admission: EM | Admit: 2024-03-13 | Discharge: 2024-03-13 | Disposition: A | Discharge status: Home / Self Care | Payer: No Typology Code available for payment source | Attending: Family Medicine | Admitting: Family Medicine

## 2024-03-13 DIAGNOSIS — M62830 Muscle spasm of back: Secondary | ICD-10-CM

## 2024-03-13 DIAGNOSIS — S161XXA Strain of muscle, fascia and tendon at neck level, initial encounter: Secondary | ICD-10-CM

## 2024-03-13 DIAGNOSIS — S39012A Strain of muscle, fascia and tendon of lower back, initial encounter: Secondary | ICD-10-CM | POA: Diagnosis not present

## 2024-03-13 MED ORDER — CYCLOBENZAPRINE HCL 10 MG PO TABS: ORAL_TABLET | ORAL | 0 refills | Status: AC

## 2024-03-13 MED ORDER — DICLOFENAC SODIUM 75 MG PO TBEC: 75.0000 mg | DELAYED_RELEASE_TABLET | Freq: Two times a day (BID) | ORAL | 0 refills | Status: AC
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
